# Patient Record
Sex: Female | Born: 1960
Health system: Southern US, Community
[De-identification: ages and names within clinical notes are randomized; demographics above are authoritative.]

---

## 2001-03-20 ENCOUNTER — Emergency Department (HOSPITAL_COMMUNITY): Admission: EM | Admit: 2001-03-20 | Discharge: 2001-03-20 | Payer: Self-pay | Admitting: Emergency Medicine

## 2001-03-20 ENCOUNTER — Encounter: Payer: Self-pay | Admitting: Emergency Medicine

## 2001-05-14 ENCOUNTER — Other Ambulatory Visit: Admission: RE | Admit: 2001-05-14 | Discharge: 2001-05-14 | Payer: Self-pay | Admitting: Obstetrics and Gynecology

## 2001-07-06 ENCOUNTER — Ambulatory Visit (HOSPITAL_COMMUNITY): Admission: RE | Admit: 2001-07-06 | Discharge: 2001-07-06 | Payer: Self-pay | Admitting: Orthopedic Surgery

## 2001-07-06 ENCOUNTER — Encounter: Payer: Self-pay | Admitting: Orthopedic Surgery

## 2001-08-15 ENCOUNTER — Encounter: Admission: RE | Admit: 2001-08-15 | Discharge: 2001-08-15 | Payer: Self-pay | Admitting: Family Medicine

## 2001-08-15 ENCOUNTER — Encounter: Payer: Self-pay | Admitting: Family Medicine

## 2001-08-28 ENCOUNTER — Encounter: Payer: Self-pay | Admitting: Neurosurgery

## 2001-08-31 ENCOUNTER — Encounter: Payer: Self-pay | Admitting: Neurosurgery

## 2001-08-31 ENCOUNTER — Ambulatory Visit (HOSPITAL_COMMUNITY): Admission: RE | Admit: 2001-08-31 | Discharge: 2001-09-01 | Payer: Self-pay | Admitting: Neurosurgery

## 2001-10-01 ENCOUNTER — Ambulatory Visit (HOSPITAL_COMMUNITY): Admission: RE | Admit: 2001-10-01 | Discharge: 2001-10-01 | Payer: Self-pay | Admitting: Neurosurgery

## 2001-10-01 ENCOUNTER — Encounter: Payer: Self-pay | Admitting: Neurosurgery

## 2001-10-25 ENCOUNTER — Encounter: Admission: RE | Admit: 2001-10-25 | Discharge: 2001-10-25 | Payer: Self-pay | Admitting: Neurosurgery

## 2001-10-25 ENCOUNTER — Encounter: Payer: Self-pay | Admitting: Neurosurgery

## 2002-03-05 ENCOUNTER — Encounter: Admission: RE | Admit: 2002-03-05 | Discharge: 2002-03-05 | Payer: Self-pay | Admitting: Neurosurgery

## 2002-03-05 ENCOUNTER — Encounter: Payer: Self-pay | Admitting: Neurosurgery

## 2002-10-04 ENCOUNTER — Other Ambulatory Visit: Admission: RE | Admit: 2002-10-04 | Discharge: 2002-10-04 | Payer: Self-pay | Admitting: Obstetrics & Gynecology

## 2003-05-02 ENCOUNTER — Ambulatory Visit (HOSPITAL_COMMUNITY): Admission: RE | Admit: 2003-05-02 | Discharge: 2003-05-02 | Payer: Self-pay | Admitting: Family Medicine

## 2003-05-02 ENCOUNTER — Encounter: Payer: Self-pay | Admitting: Family Medicine

## 2004-10-11 ENCOUNTER — Ambulatory Visit (HOSPITAL_COMMUNITY): Admission: RE | Admit: 2004-10-11 | Discharge: 2004-10-11 | Payer: Self-pay | Admitting: Family Medicine

## 2005-02-21 ENCOUNTER — Other Ambulatory Visit: Admission: RE | Admit: 2005-02-21 | Discharge: 2005-02-21 | Payer: Self-pay | Admitting: Family Medicine

## 2005-07-05 ENCOUNTER — Other Ambulatory Visit: Admission: RE | Admit: 2005-07-05 | Discharge: 2005-07-05 | Payer: Self-pay | Admitting: Family Medicine

## 2005-11-09 ENCOUNTER — Ambulatory Visit (HOSPITAL_COMMUNITY): Admission: RE | Admit: 2005-11-09 | Discharge: 2005-11-09 | Payer: Self-pay | Admitting: Family Medicine

## 2006-02-01 ENCOUNTER — Other Ambulatory Visit: Admission: RE | Admit: 2006-02-01 | Discharge: 2006-02-01 | Payer: Self-pay | Admitting: Family Medicine

## 2006-06-07 ENCOUNTER — Other Ambulatory Visit: Admission: RE | Admit: 2006-06-07 | Discharge: 2006-06-07 | Payer: Self-pay | Admitting: Family Medicine

## 2007-01-15 ENCOUNTER — Other Ambulatory Visit: Admission: RE | Admit: 2007-01-15 | Discharge: 2007-01-15 | Payer: Self-pay | Admitting: Family Medicine

## 2007-03-05 ENCOUNTER — Encounter: Admission: RE | Admit: 2007-03-05 | Discharge: 2007-03-05 | Payer: Self-pay | Admitting: Family Medicine

## 2007-11-13 ENCOUNTER — Ambulatory Visit (HOSPITAL_COMMUNITY): Admission: RE | Admit: 2007-11-13 | Discharge: 2007-11-13 | Payer: Self-pay | Admitting: Family Medicine

## 2008-03-26 ENCOUNTER — Other Ambulatory Visit: Admission: RE | Admit: 2008-03-26 | Discharge: 2008-03-26 | Payer: Self-pay | Admitting: Family Medicine

## 2008-10-15 ENCOUNTER — Ambulatory Visit (HOSPITAL_COMMUNITY): Admission: RE | Admit: 2008-10-15 | Discharge: 2008-10-15 | Payer: Self-pay | Admitting: Family Medicine

## 2009-04-28 ENCOUNTER — Other Ambulatory Visit: Admission: RE | Admit: 2009-04-28 | Discharge: 2009-04-28 | Payer: Self-pay | Admitting: Family Medicine

## 2009-08-26 ENCOUNTER — Ambulatory Visit (HOSPITAL_COMMUNITY): Admission: RE | Admit: 2009-08-26 | Discharge: 2009-08-26 | Payer: Self-pay | Admitting: Urology

## 2009-11-23 ENCOUNTER — Ambulatory Visit (HOSPITAL_COMMUNITY): Admission: RE | Admit: 2009-11-23 | Discharge: 2009-11-23 | Payer: Self-pay | Admitting: Family Medicine

## 2010-11-09 ENCOUNTER — Other Ambulatory Visit (HOSPITAL_COMMUNITY): Payer: Self-pay | Admitting: Family Medicine

## 2010-11-09 DIAGNOSIS — Z1231 Encounter for screening mammogram for malignant neoplasm of breast: Secondary | ICD-10-CM

## 2010-11-25 ENCOUNTER — Ambulatory Visit (HOSPITAL_COMMUNITY)
Admission: RE | Admit: 2010-11-25 | Discharge: 2010-11-25 | Disposition: A | Discharge status: Home / Self Care | Payer: BLUE CROSS/BLUE SHIELD | Source: Ambulatory Visit | Attending: Family Medicine | Admitting: Family Medicine

## 2010-11-25 DIAGNOSIS — Z1231 Encounter for screening mammogram for malignant neoplasm of breast: Secondary | ICD-10-CM | POA: Insufficient documentation

## 2010-12-10 NOTE — Op Note (Signed)
[place]. [facility]  Patient:    Sandra Hale, Sandra Hale Visit Number: [id number] MRN: [id number]          Service Type: DSU Location: 3000 [id number] [id number] Attending Physician:  Mariam Dollar Dictated by:   Garlon Hatchet., M.D. Proc. Date: 08/31/01 Admit Date:  08/31/2001 Discharge Date: 09/01/2001                             Operative Report  PREOPERATIVE DIAGNOSIS:  C7 radiculopathy, right, from cervical spondylosis and ruptured disk at C6-7.  PROCEDURE:  Anterior cervical diskectomy and fusion at C6-7 with two ______ patellar wedge 6 mm, and 22 mm ______ plate, and four 13 mm variable angled screws.  SURGEON:  Garlon Hatchet., M.D.  ASSISTANT:  Reinaldo Meeker, M.D.  ANESTHESIA:  General endotracheal.  INDICATIONS FOR PROCEDURE:  The patient is a very pleasant 50 year old female who has long-standing neck and right arm pain radiating down to her first two fingers.  She also had weakness of her triceps at about 4/5.  Imaging showed severe spinal cord compression of the right C6-7 nerve root from spondylitic disease, and a ruptured disk at C6-7.  The patient was extensively counseled and recommended anterior cervical diskectomy and fusion. She decided to proceed forward.  DESCRIPTION OF PROCEDURE:  The patient was brought to the operating room. Induced under general anesthesia.  The patient was placed supine with neck in slight extension, 5 pounds of Halter traction.  The right side of her neck was prepped and draped in usual sterile fashion.  With x-ray to localize needle of the C6-7 disk space, a curvilinear incision was made just off the ______ the sternocleidomastoid.  The superior part of the platysma was dissected out, and divided longitudinally.  As it was being divided, a small superficial vein was opened up.  This was coagulated and divided, and then the remainder of the ______ was divided.  The ______, sternocleidomastoid, ______ was  ______ prevertebral fascia.  The fascia was dissected with Kitners.  Longus coli was then reflected laterally.  Intraoperative x-ray confirmed localization of the C6-7 disk space.  A self-retaining retractor was placed.  Annulotomies with 11 blade scalpel, pituitary rongeurs were used to remove the ______ and annulus.  High speed drill was used to drill out the remainder of the disk space down the posterior longitudinal ligament.  Posterior osteophyte then using 2 and 3 mm Kerrison punch, the remainder of the posterior longitudinal ligament was removed in a piecemeal fashion.  Then the operating microscope was draped, brought into the field.  Under excellent illumination, the thecal sac was visualized and decompressed radically out both C7 neural foramen.  There was a moderate amount of thecal sac compression coming from disk material.  The ______, as well as extensive hypertrophy compressing the right C7 nerve root. This was all removed in a piecemeal fashion.  Both C7 neural foramina were widely opened.  Next, a angled nerve hook noted no further compression.  The end plates were then prepared to receive the bone graft.  Interbody surface was placed, a 6 mm patellar wedge was cut 2 mm off the depth and inserted under compression, approximately 1 mm deep to the anterior vertebral body line, then the weight was removed, and the anterior vertebral bodies were prepared to receive the plate.  A 23 mm ______ plate was sized, selected, and four 13 mm variable angled screws were drilled,  tapped, and then placed. Postoperative x-ray confirmed good position of plates, screws, and bone graft. The wound was copiously irrigated.  Meticulous hemostasis was maintained.  The platysma was reapproximated with 3-0 interrupted Vicryl.  The skin was closed with running 4-0 subcuticular.  Benzoin and Steri-Strips were applied.  The patient went to the recovery room in stable condition.  At the end of the case,  all sponge, needle, and instrument counts were correct. Dictated by:   Garlon Hatchet., M.D. Attending Physician:  Mariam Dollar DD:  08/31/01 TD:  09/02/01 Job: [id number]

## 2011-02-15 IMAGING — RF DG VCUG
11 series · 20 of 24 positions shown · non-contrast
Comparison: None.

CLINICAL DATA: Evaluate for urethral diverticulum

VOIDING CYSTOURETHROGRAM
TECHNIQUE: After catheterization of the urinary bladder following
sterile technique the bladder was filled with 300 ml Cysto-hypaque
30% by drip infusion.  Serial spot images were obtained during
bladder filling and voiding.
Fluoroscopy Time: 3.1 minutes

[Series 1: run · 2 of 3 slices shown (1 of 11)]
[im 1/3]
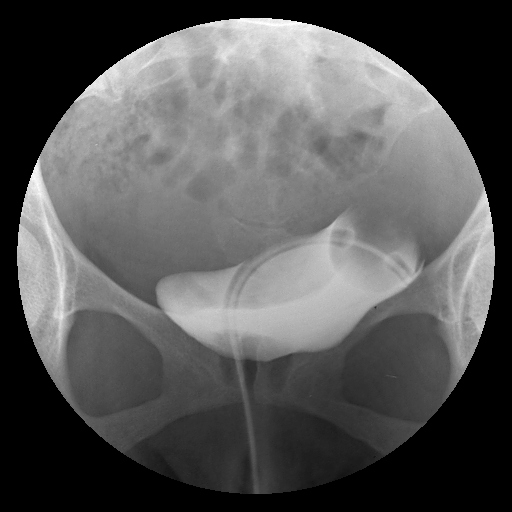
[im 2/3]
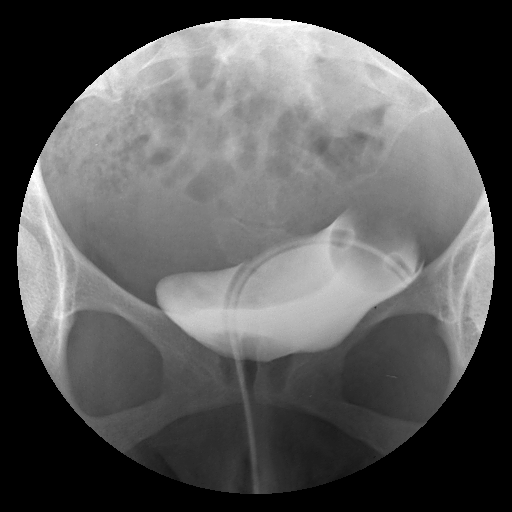

[Series 2: run · 1 of 1 slices shown (2 of 11)]
[im 1/1]
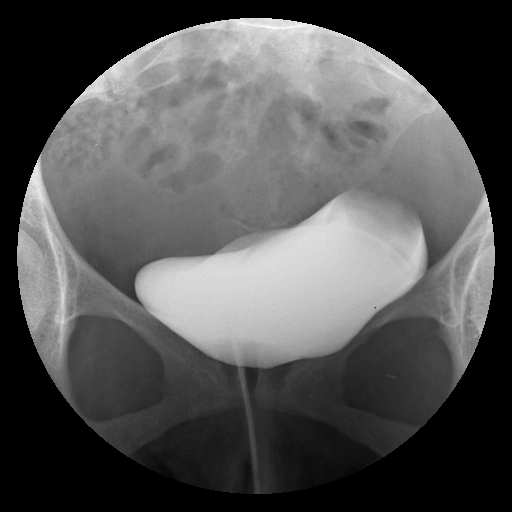

[Series 3: run · 1 of 1 slices shown (3 of 11)]
[im 1/1]
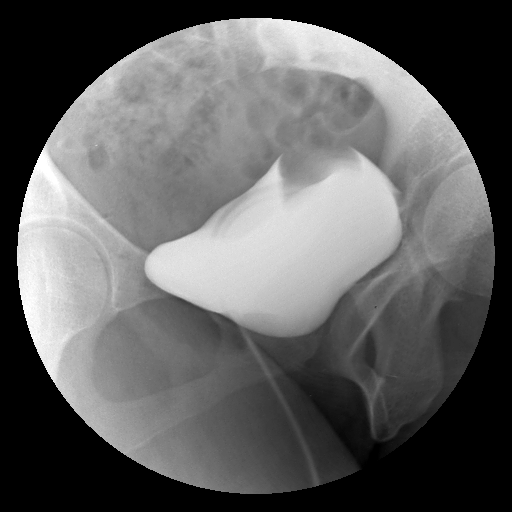

[Series 4: run · 1 of 1 slices shown (4 of 11)]
[im 1/1]
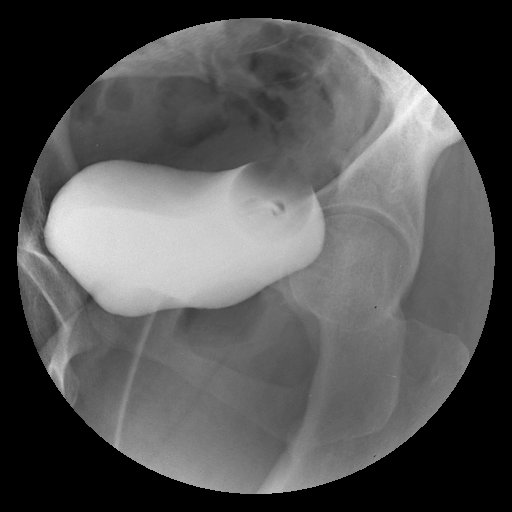

[Series 5: run · 1 of 1 slices shown (5 of 11)]
[im 1/1]
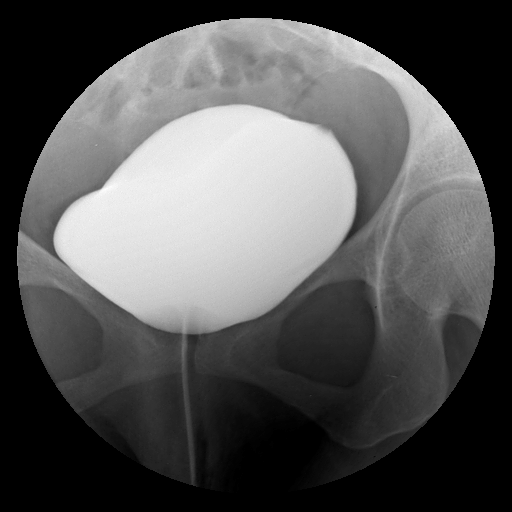

[Series 6: run · 3 of 4 slices shown (6 of 11)]
[im 1/4]
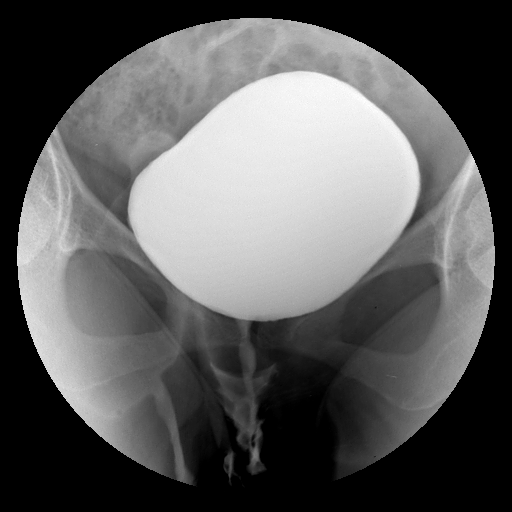
[im 3/4]
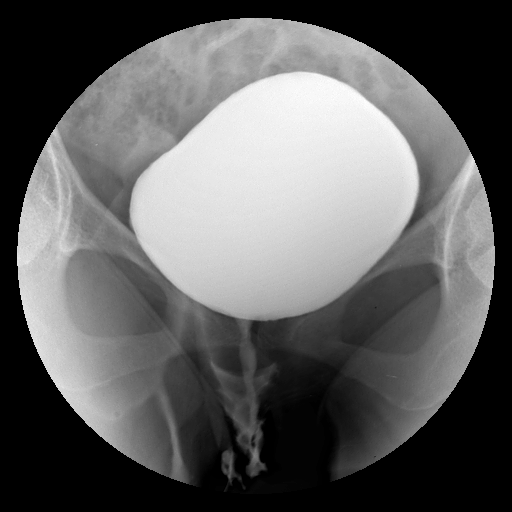
[im 4/4]
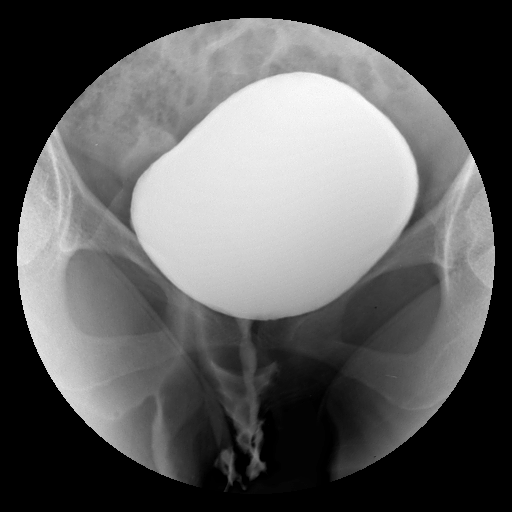

[Series 7: run · 3 of 3 slices shown (7 of 11)]
[im 1/3]
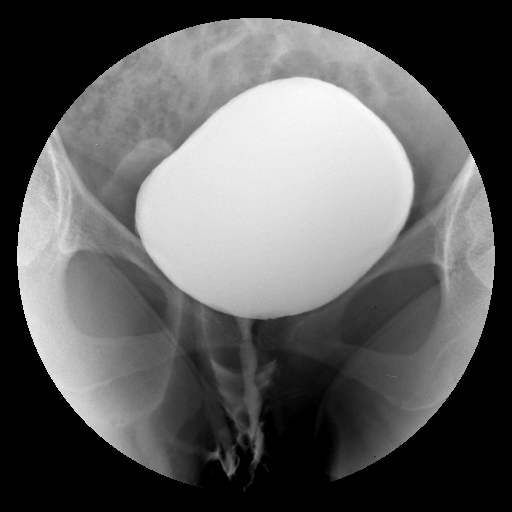
[im 2/3]
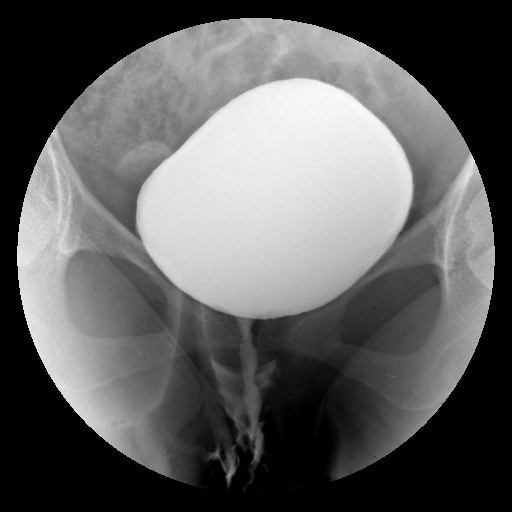
[im 3/3]
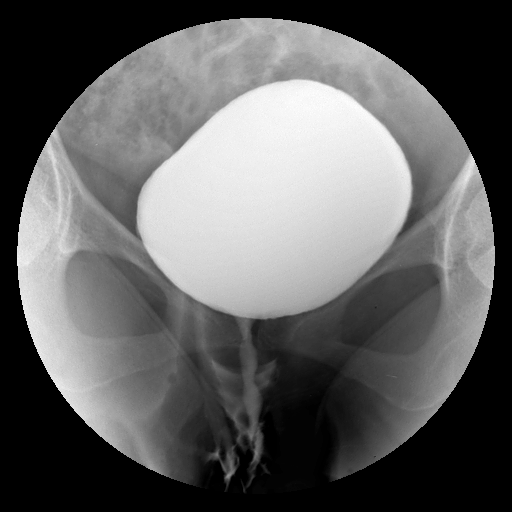

[Series 8: run · 2 of 3 slices shown (8 of 11)]
[im 2/3]
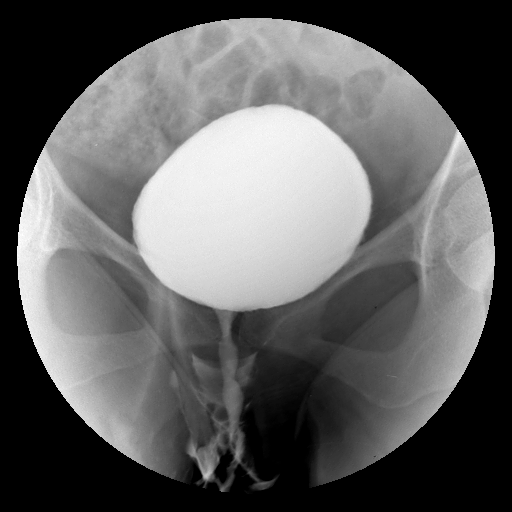
[im 3/3]
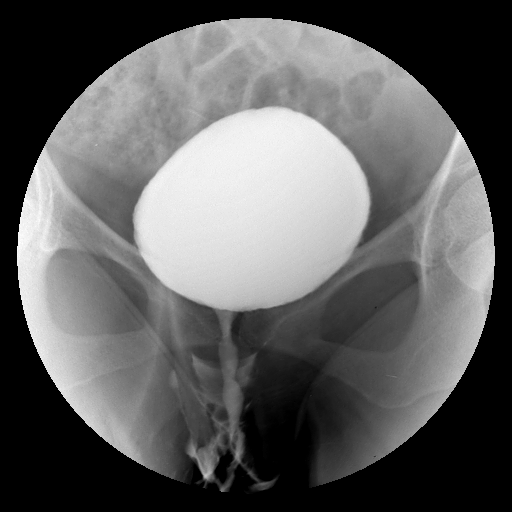

[Series 9: run · 3 of 3 slices shown (9 of 11)]
[im 1/3]
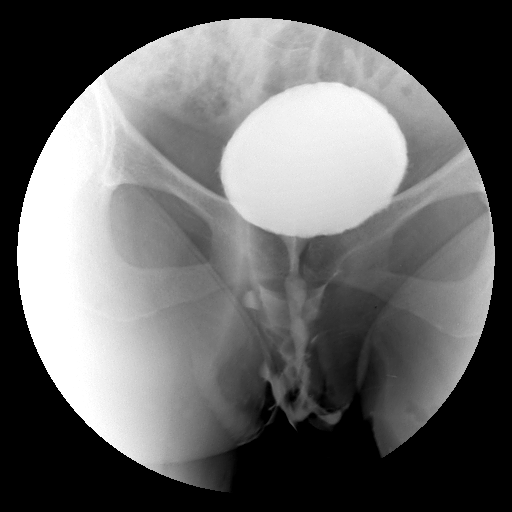
[im 2/3]
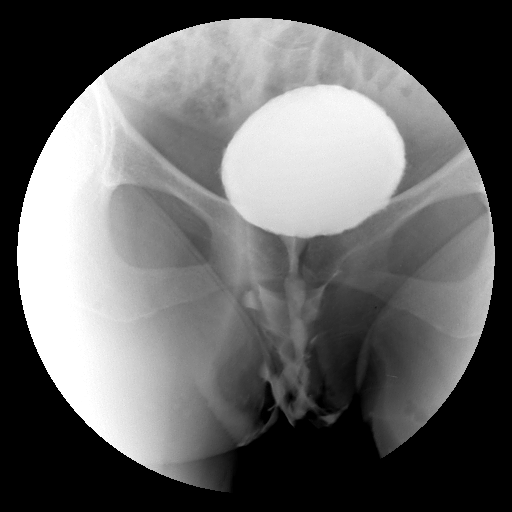
[im 3/3]
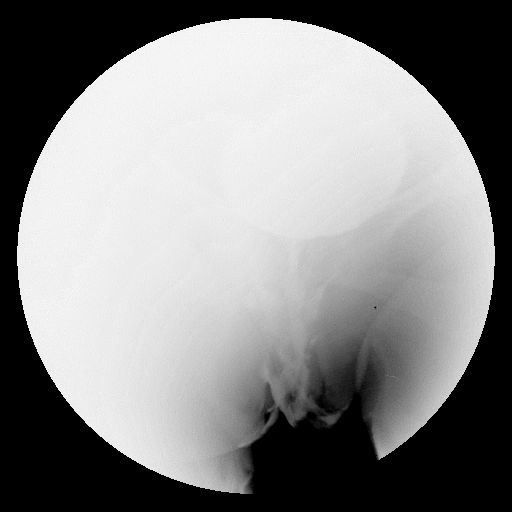

[Series 10: run · 1 of 2 slices shown (10 of 11)]
[im 2/2]
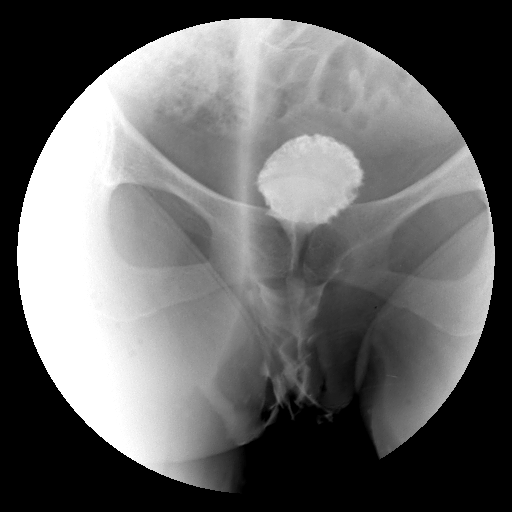

[Series 11: run · 2 of 2 slices shown (11 of 11)]
[im 1/2]
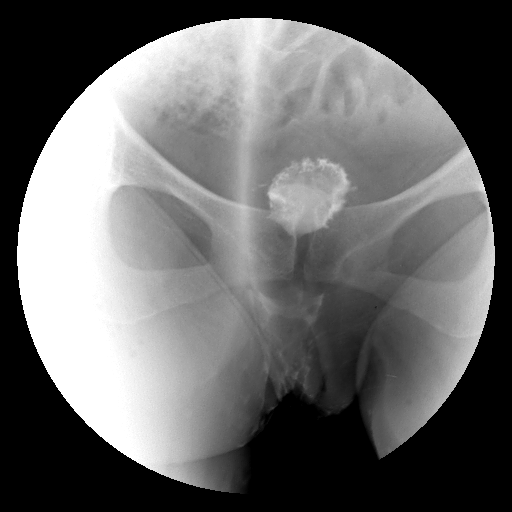
[im 2/2]
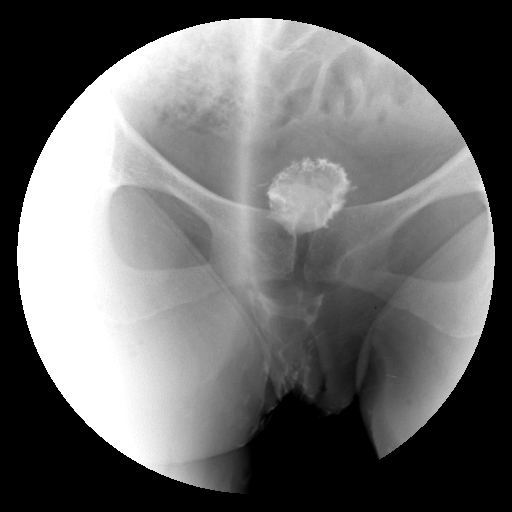

[20 of 24 positions shown; findings below may reference images not displayed]

FINDINGS: Bladder appears slightly low lying raises a question of
mild cystocele.  Bladder contours normal.  No vesicoureteral
reflux.  The urethra is normal.  No evidence of a urethral
diverticulum.
IMPRESSION: Negative VCUG.

## 2011-06-03 ENCOUNTER — Other Ambulatory Visit (HOSPITAL_COMMUNITY)
Admission: RE | Admit: 2011-06-03 | Discharge: 2011-06-03 | Disposition: A | Discharge status: Home / Self Care | Payer: BC Managed Care – PPO | Source: Ambulatory Visit | Attending: Family Medicine | Admitting: Family Medicine

## 2011-06-03 DIAGNOSIS — Z0000 Encounter for general adult medical examination without abnormal findings: Secondary | ICD-10-CM | POA: Insufficient documentation

## 2011-12-14 ENCOUNTER — Other Ambulatory Visit (HOSPITAL_COMMUNITY): Payer: Self-pay | Admitting: Family Medicine

## 2011-12-14 DIAGNOSIS — Z1231 Encounter for screening mammogram for malignant neoplasm of breast: Secondary | ICD-10-CM

## 2012-01-06 ENCOUNTER — Ambulatory Visit (HOSPITAL_COMMUNITY): Payer: BC Managed Care – PPO

## 2012-01-06 ENCOUNTER — Ambulatory Visit (HOSPITAL_COMMUNITY)
Admission: RE | Admit: 2012-01-06 | Discharge: 2012-01-06 | Disposition: A | Discharge status: Home / Self Care | Payer: BC Managed Care – PPO | Source: Ambulatory Visit | Attending: Family Medicine | Admitting: Family Medicine

## 2012-01-06 DIAGNOSIS — Z1231 Encounter for screening mammogram for malignant neoplasm of breast: Secondary | ICD-10-CM

## 2012-01-11 ENCOUNTER — Other Ambulatory Visit: Payer: Self-pay | Admitting: Family Medicine

## 2012-01-11 DIAGNOSIS — R928 Other abnormal and inconclusive findings on diagnostic imaging of breast: Secondary | ICD-10-CM

## 2012-01-19 ENCOUNTER — Ambulatory Visit
Admission: RE | Admit: 2012-01-19 | Discharge: 2012-01-19 | Disposition: A | Discharge status: Home / Self Care | Payer: BC Managed Care – PPO | Source: Ambulatory Visit | Attending: Family Medicine | Admitting: Family Medicine

## 2012-01-19 DIAGNOSIS — R928 Other abnormal and inconclusive findings on diagnostic imaging of breast: Secondary | ICD-10-CM

## 2012-11-12 ENCOUNTER — Ambulatory Visit: Payer: BC Managed Care – PPO | Attending: Podiatry | Admitting: Rehabilitation

## 2012-11-12 DIAGNOSIS — M25579 Pain in unspecified ankle and joints of unspecified foot: Secondary | ICD-10-CM | POA: Insufficient documentation

## 2012-11-12 DIAGNOSIS — IMO0001 Reserved for inherently not codable concepts without codable children: Secondary | ICD-10-CM | POA: Insufficient documentation

## 2012-11-12 DIAGNOSIS — M25673 Stiffness of unspecified ankle, not elsewhere classified: Secondary | ICD-10-CM | POA: Insufficient documentation

## 2012-11-12 DIAGNOSIS — M25676 Stiffness of unspecified foot, not elsewhere classified: Secondary | ICD-10-CM | POA: Insufficient documentation

## 2012-11-16 ENCOUNTER — Ambulatory Visit: Payer: BC Managed Care – PPO | Admitting: Physical Therapy

## 2012-11-20 ENCOUNTER — Ambulatory Visit: Payer: BC Managed Care – PPO | Admitting: Physical Therapy

## 2012-11-22 ENCOUNTER — Ambulatory Visit: Payer: BC Managed Care – PPO | Attending: Podiatry | Admitting: Physical Therapy

## 2012-11-22 DIAGNOSIS — M25676 Stiffness of unspecified foot, not elsewhere classified: Secondary | ICD-10-CM | POA: Insufficient documentation

## 2012-11-22 DIAGNOSIS — IMO0001 Reserved for inherently not codable concepts without codable children: Secondary | ICD-10-CM | POA: Insufficient documentation

## 2012-11-22 DIAGNOSIS — M25673 Stiffness of unspecified ankle, not elsewhere classified: Secondary | ICD-10-CM | POA: Insufficient documentation

## 2012-11-22 DIAGNOSIS — M25579 Pain in unspecified ankle and joints of unspecified foot: Secondary | ICD-10-CM | POA: Insufficient documentation

## 2012-11-27 ENCOUNTER — Ambulatory Visit: Payer: BC Managed Care – PPO | Admitting: Physical Therapy

## 2012-11-29 ENCOUNTER — Ambulatory Visit: Payer: BC Managed Care – PPO | Admitting: Physical Therapy

## 2012-12-04 ENCOUNTER — Ambulatory Visit: Payer: BC Managed Care – PPO | Admitting: Physical Therapy

## 2012-12-06 ENCOUNTER — Ambulatory Visit: Payer: BC Managed Care – PPO | Admitting: Physical Therapy

## 2012-12-10 ENCOUNTER — Ambulatory Visit: Payer: BC Managed Care – PPO | Admitting: Physical Therapy

## 2012-12-11 ENCOUNTER — Ambulatory Visit: Payer: BC Managed Care – PPO | Admitting: Physical Therapy

## 2012-12-13 ENCOUNTER — Ambulatory Visit: Payer: BC Managed Care – PPO | Admitting: Physical Therapy

## 2012-12-18 ENCOUNTER — Ambulatory Visit: Payer: BC Managed Care – PPO | Admitting: Physical Therapy

## 2012-12-20 ENCOUNTER — Ambulatory Visit: Payer: BC Managed Care – PPO | Admitting: Physical Therapy

## 2012-12-31 ENCOUNTER — Other Ambulatory Visit (HOSPITAL_COMMUNITY): Payer: Self-pay | Admitting: Family Medicine

## 2012-12-31 DIAGNOSIS — Z1231 Encounter for screening mammogram for malignant neoplasm of breast: Secondary | ICD-10-CM

## 2013-01-21 ENCOUNTER — Ambulatory Visit (HOSPITAL_COMMUNITY)
Admission: RE | Admit: 2013-01-21 | Discharge: 2013-01-21 | Disposition: A | Discharge status: Home / Self Care | Payer: BC Managed Care – PPO | Source: Ambulatory Visit | Attending: Family Medicine | Admitting: Family Medicine

## 2013-01-21 DIAGNOSIS — Z1231 Encounter for screening mammogram for malignant neoplasm of breast: Secondary | ICD-10-CM | POA: Insufficient documentation

## 2013-06-26 ENCOUNTER — Other Ambulatory Visit (HOSPITAL_COMMUNITY)
Admission: RE | Admit: 2013-06-26 | Discharge: 2013-06-26 | Disposition: A | Discharge status: Home / Self Care | Payer: BC Managed Care – PPO | Source: Ambulatory Visit | Attending: Family Medicine | Admitting: Family Medicine

## 2013-06-26 ENCOUNTER — Other Ambulatory Visit: Payer: Self-pay | Admitting: Physician Assistant

## 2013-06-26 DIAGNOSIS — Z0000 Encounter for general adult medical examination without abnormal findings: Secondary | ICD-10-CM | POA: Insufficient documentation

## 2014-01-07 ENCOUNTER — Other Ambulatory Visit (HOSPITAL_COMMUNITY): Payer: Self-pay | Admitting: Family Medicine

## 2014-01-07 DIAGNOSIS — Z1231 Encounter for screening mammogram for malignant neoplasm of breast: Secondary | ICD-10-CM

## 2014-02-03 ENCOUNTER — Ambulatory Visit (HOSPITAL_COMMUNITY)
Admission: RE | Admit: 2014-02-03 | Discharge: 2014-02-03 | Disposition: A | Discharge status: Home / Self Care | Payer: BC Managed Care – PPO | Source: Ambulatory Visit | Attending: Family Medicine | Admitting: Family Medicine

## 2014-02-03 DIAGNOSIS — Z1231 Encounter for screening mammogram for malignant neoplasm of breast: Secondary | ICD-10-CM

## 2014-02-04 ENCOUNTER — Other Ambulatory Visit: Payer: Self-pay | Admitting: Family Medicine

## 2014-02-04 DIAGNOSIS — R928 Other abnormal and inconclusive findings on diagnostic imaging of breast: Secondary | ICD-10-CM

## 2014-02-10 ENCOUNTER — Ambulatory Visit
Admission: RE | Admit: 2014-02-10 | Discharge: 2014-02-10 | Disposition: A | Discharge status: Home / Self Care | Payer: BC Managed Care – PPO | Source: Ambulatory Visit | Attending: Family Medicine | Admitting: Family Medicine

## 2014-02-10 ENCOUNTER — Other Ambulatory Visit: Payer: Self-pay | Admitting: Family Medicine

## 2014-02-10 DIAGNOSIS — R928 Other abnormal and inconclusive findings on diagnostic imaging of breast: Secondary | ICD-10-CM

## 2014-08-26 ENCOUNTER — Other Ambulatory Visit (INDEPENDENT_AMBULATORY_CARE_PROVIDER_SITE_OTHER): Payer: Self-pay | Admitting: Surgery

## 2014-08-26 NOTE — H&P (Signed)
Sandra Hale 08/26/2014 10:22 AM Location: [facility] Patient #: [id number] DOB: [birth year] Divorced / Language: [place] / Race: Black or African American Female History of Present Illness Sandra Sportsman MD; 08/26/2014 7:07 PM) Patient words: eval hems.  The patient is a 54 year old female who presents with hemorrhoids. Pleasant woman sent by Dr. Romeo Apple for concerns of symptomatic hemorrhoids. Patient's noticed external hemorrhoids for many months. Often struggles with constipation. Started on MiraLAX. Intermittently compliant. Still gets some flares. Get some annoying bleeding and irritation with it. Had a colonoscopy 2 years ago that was normal. Has a bowel movement about twice a week. Can walk a half hour without difficulty. No history of Crohn's or ulcerative colitis. No heartburn or reflux. No inflammatory bowel disease. No history of fissure or fistula. No history of pilonidal disease. Only surgery was tubal ligation. Other Problems Gilmer Mor, CMA; 08/26/2014 10:22 AM) Hemorrhoids  Past Surgical History Gilmer Mor, CMA; 08/26/2014 10:22 AM) Colon Polyp Removal - Colonoscopy Foot Surgery Left.  Diagnostic Studies History Gilmer Mor, CMA; 08/26/2014 10:22 AM) Colonoscopy 1-5 years ago Mammogram within last year Pap Smear 1-5 years ago  Allergies Gilmer Mor, CMA; 08/26/2014 10:26 AM) No Known Drug Allergies 08/26/2014  Medication History (Sonya Bynum, CMA; 08/26/2014 10:26 AM) No Current Medications  Social History Gilmer Mor, CMA; 08/26/2014 10:22 AM) Alcohol use Occasional alcohol use. Caffeine use Carbonated beverages, Tea. No drug use Tobacco use Never smoker.  Family History Gilmer Mor, CMA; 08/26/2014 10:22 AM) Breast Cancer Mother. Colon Polyps Brother. Diabetes Mellitus Mother. Prostate Cancer Father.  Pregnancy / Birth History Gilmer Mor, CMA; 08/26/2014 10:22 AM) Age at menarche 12 years. Contraceptive History Oral  contraceptives. Gravida 2 Irregular periods Maternal age 46-30 Para 2     Review of Systems Lamar Laundry Bynum CMA; 08/26/2014 10:22 AM) General Not Present- Appetite Loss, Chills, Fatigue, Fever, Night Sweats, Weight Gain and Weight Loss. Skin Present- Dryness. Not Present- Change in Wart/Mole, Hives, Jaundice, New Lesions, Non-Healing Wounds, Rash and Ulcer. HEENT Present- Wears glasses/contact lenses. Not Present- Earache, Hearing Loss, Hoarseness, Nose Bleed, Oral Ulcers, Ringing in the Ears, Seasonal Allergies, Sinus Pain, Sore Throat, Visual Disturbances and Yellow Eyes. Respiratory Not Present- Bloody sputum, Chronic Cough, Difficulty Breathing, Snoring and Wheezing. Breast Not Present- Breast Mass, Breast Pain, Nipple Discharge and Skin Changes. Cardiovascular Not Present- Chest Pain, Difficulty Breathing Lying Down, Leg Cramps, Palpitations, Rapid Heart Rate, Shortness of Breath and Swelling of Extremities. Gastrointestinal Present- Constipation and Hemorrhoids. Not Present- Abdominal Pain, Bloating, Bloody Stool, Change in Bowel Habits, Chronic diarrhea, Difficulty Swallowing, Excessive gas, Gets full quickly at meals, Indigestion, Nausea, Rectal Pain and Vomiting. Female Genitourinary Not Present- Frequency, Nocturia, Painful Urination, Pelvic Pain and Urgency. Musculoskeletal Not Present- Back Pain, Joint Pain, Joint Stiffness, Muscle Pain, Muscle Weakness and Swelling of Extremities. Neurological Not Present- Decreased Memory, Fainting, Headaches, Numbness, Seizures, Tingling, Tremor, Trouble walking and Weakness. Psychiatric Not Present- Anxiety, Bipolar, Change in Sleep Pattern, Depression, Fearful and Frequent crying. Endocrine Not Present- Cold Intolerance, Excessive Hunger, Hair Changes, Heat Intolerance, Hot flashes and New Diabetes. Hematology Not Present- Easy Bruising, Excessive bleeding, Gland problems, HIV and Persistent Infections.  Vitals (Sonya Bynum CMA; 08/26/2014 10:26  AM) 08/26/2014 10:25 AM Weight: 167 lb Height: 62in Body Surface Area: 1.82 m Body Mass Index: 30.54 kg/m Temp.: 97.84F(Temporal)  Pulse: 72 (Regular)  BP: 130/74 (Sitting, Left Arm, Standard)     Physical Exam Sandra Sportsman MD; 08/26/2014 10:56 AM)  General Mental Status-Alert. General Appearance-Not  in acute distress, Not Sickly. Orientation-Oriented X3. Hydration-Well hydrated. Voice-Normal.  Integumentary Global Assessment Upon inspection and palpation of skin surfaces of the - Axillae: non-tender, no inflammation or ulceration, no drainage. and Distribution of scalp and body hair is normal. General Characteristics Temperature - normal warmth is noted.  Head and Neck Head-normocephalic, atraumatic with no lesions or palpable masses. Face Global Assessment - atraumatic, no absence of expression. Neck Global Assessment - no abnormal movements, no bruit auscultated on the right, no bruit auscultated on the left, no decreased range of motion, non-tender. Trachea-midline. Thyroid Gland Characteristics - non-tender.  Eye Eyeball - Left-Extraocular movements intact, No Nystagmus. Eyeball - Right-Extraocular movements intact, No Nystagmus. Cornea - Left-No Hazy. Cornea - Right-No Hazy. Sclera/Conjunctiva - Left-No scleral icterus, No Discharge. Sclera/Conjunctiva - Right-No scleral icterus, No Discharge. Pupil - Left-Direct reaction to light normal. Pupil - Right-Direct reaction to light normal.  ENMT Ears Pinna - Left - no drainage observed, no generalized tenderness observed. Right - no drainage observed, no generalized tenderness observed. Nose and Sinuses External Inspection of the Nose - no destructive lesion observed. Inspection of the nares - Left - quiet respiration. Right - quiet respiration. Mouth and Throat Lips - Upper Lip - no fissures observed, no pallor noted. Lower Lip - no fissures observed, no pallor noted.  Nasopharynx - no discharge present. Oral Cavity/Oropharynx - Tongue - no dryness observed. Oral Mucosa - no cyanosis observed. Hypopharynx - no evidence of airway distress observed.  Chest and Lung Exam Inspection Movements - Normal and Symmetrical. Accessory muscles - No use of accessory muscles in breathing. Palpation Palpation of the chest reveals - Non-tender. Auscultation Breath sounds - Normal and Clear.  Cardiovascular Auscultation Rhythm - Regular. Murmurs & Other Heart Sounds - Auscultation of the heart reveals - No Murmurs and No Systolic Clicks.  Abdomen Inspection Inspection of the abdomen reveals - No Visible peristalsis and No Abnormal pulsations. Umbilicus - No Bleeding, No Urine drainage. Palpation/Percussion Palpation and Percussion of the abdomen reveal - Soft, Non Tender, No Rebound tenderness, No Rigidity (guarding) and No Cutaneous hyperesthesia.  Female Genitourinary Sexual Maturity Tanner 5 - Adult hair pattern. Note: No vaginal bleeding. Mild physiologic discharge   Rectal Note: Obvious right anterior greater than right posterior external hemorrhoids. The right anterior consistent with internal chronically prolapsed hemorrhoid. No fissure or fistula. Normal sphincter tone. Tolerated anoscopy and digital exam.  Anoscopy notes irritated left lateral internal and right posterior piles. No proctitis.   Peripheral Vascular Upper Extremity Inspection - Left - No Cyanotic nailbeds, Not Ischemic. Right - No Cyanotic nailbeds, Not Ischemic.  Neurologic Neurologic evaluation reveals -normal attention span and ability to concentrate, able to name objects and repeat phrases. Appropriate fund of knowledge , normal sensation and normal coordination. Mental Status Affect - not angry, not paranoid. Cranial Nerves-Normal Bilaterally. Gait-Normal.  Neuropsychiatric Mental status exam performed with findings of-able to articulate well with normal  speech/language, rate, volume and coherence, thought content normal with ability to perform basic computations and apply abstract reasoning and no evidence of hallucinations, delusions, obsessions or homicidal/suicidal ideation.  Musculoskeletal Global Assessment Spine, Ribs and Pelvis - no instability, subluxation or laxity. Right Upper Extremity - no instability, subluxation or laxity.  Lymphatic Head & Neck  General Head & Neck Lymphatics: Bilateral - Description - No Localized lymphadenopathy. Axillary  General Axillary Region: Bilateral - Description - No Localized lymphadenopathy. Femoral & Inguinal  Generalized Femoral & Inguinal Lymphatics: Left - Description - No Localized lymphadenopathy. Right -  Description - No Localized lymphadenopathy.   Results Sandra Hale(Loleta Frommelt C. Analie Katzman MD; 08/26/2014 7:07 PM) Procedures  Name Value Date Hemorrhoids Procedure Anal exam: External Hemorrhoid Skin tag Internal exam: Internal Hemorroids ( non-bleeding) Other: Obvious right anterior greater than right posterior external hemorrhoids. The right anterior consistent with internal chronically prolapsed hemorrhoid. No fissure or fistula. Normal sphincter tone. Tolerated anoscopy and digital exam. Anoscopy notes irritated left lateral internal and right posterior piles. No proctitis.  Performed: 08/26/2014 10:57 AM    Assessment & Plan Sandra Hale(Deadrian Toya C. Orion Vandervort MD; 08/26/2014 11:08 AM)  EXTERNAL HEMORRHOIDS WITH COMPLICATION (455.5  K64.8) Impression: Persistently symptomatic hemorrhoids. It is hard to keep clean with some irritation and discomfort. She wishes to be aggressive and have them repaired especially since her bowel on a better place and she is having less constipation.  Think is reasonable for him THD hemorrhoidal ligation on the internal piles and then external hemorrhoidectomy of the outside tissue. I did caution her she will require a while to recover from this and needs to be off  work at least a week or 2 given the sensitive location.  Current Plans Schedule for Surgery The anatomy & physiology of the anorectal region was discussed. The pathophysiology of hemorrhoids and differential diagnosis was discussed. Natural history risks without surgery was discussed. I stressed the importance of a bowel regimen to have daily soft bowel movements to minimize progression of disease. Interventions such as sclerotherapy & banding were discussed.  The patient's symptoms are not adequately controlled by medicines and other non-operative treatments. I feel the risks & problems of no surgery outweigh the operative risks; therefore, I recommended surgery to treat the hemorrhoids by ligation, pexy, and possible resection.  Risks such as bleeding, infection, urinary difficulties, need for further treatment, heart attack, death, and other risks were discussed. I noted a good likelihood this will help address the problem. Goals of post-operative recovery were discussed as well. Possibility that this will not correct all symptoms was explained. Post-operative pain, bleeding, constipation, and other problems after surgery were discussed. We will work to minimize complications. Educational handouts further explaining the pathology, treatment options, and bowel regimen were given as well. Questions were answered. The patient expresses understanding & wishes to proceed with surgery. Pt Education - [facility] Hemorrhoids (Milano Rosevear) Pt Education - [facility] Good Bowel Health (Michaelle Bottomley) Pt Education - [facility] Rectal Surgery HCI (Marshelle Bilger): discussed with patient and provided information. SIMPLE SECOND DEGREE HEMORRHOID (455.8  K64.1) Impression: Plan external hemorrhoidectomies with internal ligation.  Current Plans ANOSCOPY, DIAGNOSTIC ([id number])  Sandra SportsmanSteven C. Madisin Hasan, M.D., F.A.C.S. Gastrointestinal and Minimally Invasive Surgery [facility] [address] [place], [place] [id number] [phone]  [phone] Main / Paging  '

## 2015-11-05 DIAGNOSIS — Z0000 Encounter for general adult medical examination without abnormal findings: Secondary | ICD-10-CM | POA: Diagnosis not present

## 2015-11-09 DIAGNOSIS — H40023 Open angle with borderline findings, high risk, bilateral: Secondary | ICD-10-CM | POA: Diagnosis not present

## 2015-11-09 DIAGNOSIS — Z83511 Family history of glaucoma: Secondary | ICD-10-CM | POA: Diagnosis not present

## 2015-12-11 DIAGNOSIS — Z713 Dietary counseling and surveillance: Secondary | ICD-10-CM | POA: Diagnosis not present

## 2016-02-05 DIAGNOSIS — Z713 Dietary counseling and surveillance: Secondary | ICD-10-CM | POA: Diagnosis not present

## 2016-03-09 DIAGNOSIS — N900 Mild vulvar dysplasia: Secondary | ICD-10-CM | POA: Diagnosis not present

## 2016-03-09 DIAGNOSIS — Z01419 Encounter for gynecological examination (general) (routine) without abnormal findings: Secondary | ICD-10-CM | POA: Diagnosis not present

## 2016-03-09 DIAGNOSIS — Z1231 Encounter for screening mammogram for malignant neoplasm of breast: Secondary | ICD-10-CM | POA: Diagnosis not present

## 2016-03-09 DIAGNOSIS — Z1151 Encounter for screening for human papillomavirus (HPV): Secondary | ICD-10-CM | POA: Diagnosis not present

## 2016-03-09 DIAGNOSIS — Z6830 Body mass index (BMI) 30.0-30.9, adult: Secondary | ICD-10-CM | POA: Diagnosis not present

## 2016-04-08 DIAGNOSIS — Z713 Dietary counseling and surveillance: Secondary | ICD-10-CM | POA: Diagnosis not present

## 2016-05-07 DIAGNOSIS — R55 Syncope and collapse: Secondary | ICD-10-CM | POA: Diagnosis not present

## 2016-05-07 DIAGNOSIS — R42 Dizziness and giddiness: Secondary | ICD-10-CM | POA: Diagnosis not present

## 2016-05-07 DIAGNOSIS — M7582 Other shoulder lesions, left shoulder: Secondary | ICD-10-CM | POA: Diagnosis not present

## 2016-05-09 DIAGNOSIS — E559 Vitamin D deficiency, unspecified: Secondary | ICD-10-CM | POA: Diagnosis not present

## 2016-05-09 DIAGNOSIS — H8110 Benign paroxysmal vertigo, unspecified ear: Secondary | ICD-10-CM | POA: Diagnosis not present

## 2016-05-09 DIAGNOSIS — R42 Dizziness and giddiness: Secondary | ICD-10-CM | POA: Diagnosis not present

## 2016-05-30 DIAGNOSIS — H40023 Open angle with borderline findings, high risk, bilateral: Secondary | ICD-10-CM | POA: Diagnosis not present

## 2017-04-14 DIAGNOSIS — H40013 Open angle with borderline findings, low risk, bilateral: Secondary | ICD-10-CM | POA: Diagnosis not present

## 2017-04-19 DIAGNOSIS — N611 Abscess of the breast and nipple: Secondary | ICD-10-CM | POA: Diagnosis not present

## 2017-04-27 DIAGNOSIS — Z1231 Encounter for screening mammogram for malignant neoplasm of breast: Secondary | ICD-10-CM | POA: Diagnosis not present

## 2017-05-15 ENCOUNTER — Other Ambulatory Visit (HOSPITAL_COMMUNITY)
Admission: RE | Admit: 2017-05-15 | Discharge: 2017-05-15 | Disposition: A | Discharge status: Home / Self Care | Payer: BLUE CROSS/BLUE SHIELD | Source: Ambulatory Visit | Attending: Physician Assistant | Admitting: Physician Assistant

## 2017-05-15 ENCOUNTER — Other Ambulatory Visit: Payer: Self-pay | Admitting: Physician Assistant

## 2017-05-15 DIAGNOSIS — E559 Vitamin D deficiency, unspecified: Secondary | ICD-10-CM | POA: Diagnosis not present

## 2017-05-15 DIAGNOSIS — Z13220 Encounter for screening for lipoid disorders: Secondary | ICD-10-CM | POA: Diagnosis not present

## 2017-05-15 DIAGNOSIS — Z0000 Encounter for general adult medical examination without abnormal findings: Secondary | ICD-10-CM | POA: Insufficient documentation

## 2017-05-15 DIAGNOSIS — Z124 Encounter for screening for malignant neoplasm of cervix: Secondary | ICD-10-CM | POA: Diagnosis not present

## 2017-05-15 DIAGNOSIS — Z23 Encounter for immunization: Secondary | ICD-10-CM | POA: Diagnosis not present

## 2017-05-15 DIAGNOSIS — R7301 Impaired fasting glucose: Secondary | ICD-10-CM | POA: Diagnosis not present

## 2017-05-17 LAB — CYTOLOGY - PAP
ADEQUACY: ABSENT
DIAGNOSIS: NEGATIVE
HPV (WINDOPATH): DETECTED — AB

## 2017-06-07 DIAGNOSIS — E119 Type 2 diabetes mellitus without complications: Secondary | ICD-10-CM | POA: Diagnosis not present

## 2017-07-20 ENCOUNTER — Ambulatory Visit: Payer: BLUE CROSS/BLUE SHIELD

## 2017-07-27 ENCOUNTER — Ambulatory Visit: Payer: BLUE CROSS/BLUE SHIELD

## 2017-07-27 DIAGNOSIS — B078 Other viral warts: Secondary | ICD-10-CM | POA: Diagnosis not present

## 2017-07-27 DIAGNOSIS — L249 Irritant contact dermatitis, unspecified cause: Secondary | ICD-10-CM | POA: Diagnosis not present

## 2017-08-02 DIAGNOSIS — N6081 Other benign mammary dysplasias of right breast: Secondary | ICD-10-CM | POA: Diagnosis not present

## 2017-10-11 DIAGNOSIS — E119 Type 2 diabetes mellitus without complications: Secondary | ICD-10-CM | POA: Diagnosis not present

## 2018-04-23 DIAGNOSIS — H40023 Open angle with borderline findings, high risk, bilateral: Secondary | ICD-10-CM | POA: Diagnosis not present

## 2018-04-23 DIAGNOSIS — H40053 Ocular hypertension, bilateral: Secondary | ICD-10-CM | POA: Diagnosis not present

## 2018-05-10 DIAGNOSIS — H40023 Open angle with borderline findings, high risk, bilateral: Secondary | ICD-10-CM | POA: Diagnosis not present

## 2018-05-17 ENCOUNTER — Other Ambulatory Visit (HOSPITAL_COMMUNITY)
Admission: RE | Admit: 2018-05-17 | Discharge: 2018-05-17 | Disposition: A | Discharge status: Home / Self Care | Payer: BLUE CROSS/BLUE SHIELD | Source: Ambulatory Visit | Attending: Physician Assistant | Admitting: Physician Assistant

## 2018-05-17 ENCOUNTER — Other Ambulatory Visit: Payer: Self-pay | Admitting: Physician Assistant

## 2018-05-17 DIAGNOSIS — Z0000 Encounter for general adult medical examination without abnormal findings: Secondary | ICD-10-CM | POA: Diagnosis not present

## 2018-05-17 DIAGNOSIS — R42 Dizziness and giddiness: Secondary | ICD-10-CM | POA: Diagnosis not present

## 2018-05-17 DIAGNOSIS — E119 Type 2 diabetes mellitus without complications: Secondary | ICD-10-CM | POA: Diagnosis not present

## 2018-05-17 DIAGNOSIS — E559 Vitamin D deficiency, unspecified: Secondary | ICD-10-CM | POA: Diagnosis not present

## 2018-05-22 LAB — CYTOLOGY - PAP: HPV (WINDOPATH): DETECTED — AB

## 2018-08-02 DIAGNOSIS — R87612 Low grade squamous intraepithelial lesion on cytologic smear of cervix (LGSIL): Secondary | ICD-10-CM | POA: Diagnosis not present

## 2018-08-02 DIAGNOSIS — R87810 Cervical high risk human papillomavirus (HPV) DNA test positive: Secondary | ICD-10-CM | POA: Diagnosis not present

## 2018-08-02 DIAGNOSIS — Z1231 Encounter for screening mammogram for malignant neoplasm of breast: Secondary | ICD-10-CM | POA: Diagnosis not present

## 2018-08-30 ENCOUNTER — Telehealth: Payer: Self-pay | Admitting: *Deleted

## 2018-08-30 NOTE — Telephone Encounter (Signed)
Attempted to contact the patient regarding her new patient appt. Left a message for the patient to call the office back to schedule an appt

## 2018-08-31 ENCOUNTER — Telehealth: Payer: Self-pay | Admitting: *Deleted

## 2018-08-31 NOTE — Telephone Encounter (Signed)
Called and spoke with the patient, scheduled her to see Dr. Stanford Breed on 2/11. Patient given the directions, free valet and pelvic exam.

## 2018-09-04 ENCOUNTER — Inpatient Hospital Stay: Payer: BLUE CROSS/BLUE SHIELD | Attending: Gynecology | Admitting: Gynecology

## 2018-09-04 ENCOUNTER — Encounter: Payer: Self-pay | Admitting: Gynecology

## 2018-09-04 DIAGNOSIS — N891 Moderate vaginal dysplasia: Secondary | ICD-10-CM | POA: Diagnosis not present

## 2018-09-04 MED ORDER — FLUOROURACIL 5 % EX CREA: TOPICAL_CREAM | CUTANEOUS | 1 refills | Status: DC

## 2018-09-04 NOTE — Progress Notes (Signed)
Consult Note: Gyn-Onc   Sandra Hale 58 y.o. female  No chief complaint on file.   Assessment : Multifocal V AIN 2.  Plan: Given the multifocal nature of her vaginal dysplasia, would recommend we treat with intravaginal Efudex.  The schedule would be to apply 1 g at bedtime 4 nights in a row this month and again 4 nights next month we will have her come back then for repeat colposcopy and Pap smear a month later.  The patient is given instruction sheet on how to use intravaginal Efudex safely avoiding vulvitis.  Patient return to see me in 3 months.    HPI: 58 year old seen in consultation requested Dr. Nena Jordan cousins regarding management of newly diagnosed VIN 2.  Patient had a Pap smear on May 17, 2018 showing low-grade squamous intraepithelial lesion.  She subsequently underwent colposcopy which revealed a lesion in the vagina.  Biopsy showed VIN 2.  Endocervical curettage was negative.  The patient gives a remote past history of abnormal Pap smear which she believes was treated with cryotherapy.  She is not on any immunosuppressive drugs.  She has no other significant gynecologic history.  Review of Systems:10 point review of systems is negative except as noted in interval history.   Vitals: Blood pressure 134/69, pulse 97, temperature 99.1 F (37.3 C), temperature source Oral, resp. rate 18, height 5\' 1"  (1.549 m), weight 153 lb (69.4 kg), SpO2 99 %.  Physical Exam: General : The patient is a healthy woman in no acute distress.  HEENT: normocephalic, extraoccular movements normal; neck is supple without thyromegally  Lynphnodes: Supraclavicular and inguinal nodes not enlarged  Abdomen: Soft, non-tender, no ascites, no organomegally, no masses, no hernias  Pelvic:  EGBUS: Normal female  Vagina: Normal, no lesions  Urethra and Bladder: Normal, non-tender  Cervix: Normal  Lower extremities: No edema or varicosities. Normal range of motion  Procedure note: Colposcopic  examination of the cervix and vagina was performed using acetic acid and white light and green filter.  There are several areas in the vagina that are moderately intensely white consistent with dysplasia.  Cervix itself appears normal.     No Known Allergies  History reviewed. No pertinent past medical history.  History reviewed. No pertinent surgical history.  No current outpatient medications on file.   No current facility-administered medications for this visit.     Social History   Socioeconomic History  . Marital status: Single    Spouse name: Not on file  . Number of children: Not on file  . Years of education: Not on file  . Highest education level: Not on file  Occupational History  . Not on file  Social Needs  . Financial resource strain: Not on file  . Food insecurity:    Worry: Not on file    Inability: Not on file  . Transportation needs:    Medical: Not on file    Non-medical: Not on file  Tobacco Use  . Smoking status: Never Smoker  . Smokeless tobacco: Never Used  Substance and Sexual Activity  . Alcohol use: Never    Frequency: Never  . Drug use: Never  . Sexual activity: Not on file  Lifestyle  . Physical activity:    Days per week: Not on file    Minutes per session: Not on file  . Stress: Not on file  Relationships  . Social connections:    Talks on phone: Not on file    Gets together: Not on  file    Attends religious service: Not on file    Active member of club or organization: Not on file    Attends meetings of clubs or organizations: Not on file    Relationship status: Not on file  . Intimate partner violence:    Fear of current or ex partner: Not on file    Emotionally abused: Not on file    Physically abused: Not on file    Forced sexual activity: Not on file  Other Topics Concern  . Not on file  Social History Narrative  . Not on file    Family History  Problem Relation Age of Onset  . Breast cancer Mother   . Diabetes  Mother   . Prostate cancer Father   . Hypertension Sister       De Blanchaniel Clarke-Pearson, MD 09/04/2018, 2:41 PM

## 2018-09-04 NOTE — Patient Instructions (Signed)
See attached Efudex form.  Plan to place the efudex cream in your vagina for four nights in a row then repeat in one month then plan to see Dr. Stanford Breed at the end of the month.

## 2018-09-07 DIAGNOSIS — N6082 Other benign mammary dysplasias of left breast: Secondary | ICD-10-CM | POA: Diagnosis not present

## 2018-10-16 ENCOUNTER — Telehealth: Payer: Self-pay
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
## 2018-10-17 ENCOUNTER — Telehealth: Payer: Self-pay

## 2018-10-17 ENCOUNTER — Other Ambulatory Visit: Payer: Self-pay | Admitting: Gynecologic Oncology

## 2018-10-17 DIAGNOSIS — R102 Pelvic and perineal pain: Secondary | ICD-10-CM

## 2018-10-17 DIAGNOSIS — N9089 Other specified noninflammatory disorders of vulva and perineum: Secondary | ICD-10-CM

## 2018-10-17 MED ORDER — TRAMADOL HCL 50 MG PO TABS: 50.0000 mg | ORAL_TABLET | Freq: Four times a day (QID) | ORAL | 0 refills | Status: DC | PRN

## 2018-10-17 MED ORDER — SILVER SULFADIAZINE 1 % EX CREA: 1.0000 "application " | TOPICAL_CREAM | Freq: Three times a day (TID) | CUTANEOUS | 3 refills | Status: DC

## 2018-10-17 NOTE — Telephone Encounter (Signed)
Ms Sandra Hale states that the pain in the perineum and vagina is worse today. Pain is currently a 7/10. She is having increased difficulty standing and walking. She is most comfortable lying down. She is using the ibuprofen 600 mg alt. with tylenol 500 mg q 6 hours.  She does not feel that this is effective for the pain control. She is using the sitz bath and spritzer for urination.  This is providing more comfort with urination.  Told Ms Sandra Hale that Sandra Mccreedy, NP is going to send in silvadene cream and tramadol for the pain.  She is to apply the cream to the perineum and just slightly inside the vagina 3 times a day.  Pt verbalized understanding.

## 2018-10-17 NOTE — Progress Notes (Signed)
See RN note.  Patient did not douche the efudex cream out of the her vagina after four days of use.  She has significant irritation of the vagina and vulva from the medication.

## 2018-10-18 ENCOUNTER — Other Ambulatory Visit: Payer: Self-pay | Admitting: Gynecologic Oncology

## 2018-10-18 ENCOUNTER — Telehealth: Payer: Self-pay

## 2018-10-18 DIAGNOSIS — N9089 Other specified noninflammatory disorders of vulva and perineum: Secondary | ICD-10-CM

## 2018-10-18 MED ORDER — LIDOCAINE 5 % EX OINT: 1.0000 "application " | TOPICAL_OINTMENT | Freq: Three times a day (TID) | CUTANEOUS | 0 refills | Status: DC | PRN

## 2018-10-18 MED ORDER — LIDOCAINE HCL 2 % EX GEL: 1.0000 "application " | CUTANEOUS | 0 refills | Status: DC | PRN

## 2018-10-18 NOTE — Progress Notes (Signed)
Lidocaine gel for symptom management of vulvar excoriation due to patient leaving efudex cream in the vagina after treatment for four days in a row. Dr. Andrey Farmer and Dr. C-P aware of the situation.  No other interventions at this time.

## 2018-10-18 NOTE — Telephone Encounter (Signed)
Outgoing call to pt per Warner Mccreedy NP- notified pt that Warner Mccreedy NP has spoke with Dr Loree Fee and ordered Lidocaine @ gel to apply to external vaginal areas for pain.  Pt voiced understanding. Pt will contact our office for any worsening symptoms.

## 2018-10-18 NOTE — Telephone Encounter (Signed)
Outgoing call to patient per Warner Mccreedy NP regarding "how she doing?  When does she think she would be able to return to work?  What kind of work does she do, sitting mostly? Is Silvadene cream and/or Tramadol helping?  Pt reports she is "about the same."  Taking Ibuprofen through day and Tramadol at night because it makes her sleepy but not helping much.  Reports main thing that helps is sitz baths. Reports she is using silvadene cream and re-applying after sitz baths.    Reports has light pink drainage on pad and sees white tissue in toilet when she voids.    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  Reports best if she is lying down - most comfortable.   Notified Warner Mccreedy NP and per her, continue what she is doing, because it is just going to take time to heal, Melissa NP will complete her FMLA paperwork to be out this week and next.    I suggested she can try lying with towel under her, without any pad and panties, (described as just like a baby with severe diaper rash) to see if this would help for comfort/pain any .  Pt said she would try this as well.   Encouraged her to continue regimen per Warner Mccreedy NP that was discussed with her yesterday -see Sallye Ober RN notes and call us if any changes or worsening.  Pt voiced understanding. No other needs per pt at this time.

## 2018-11-19 ENCOUNTER — Telehealth: Payer: Self-pay | Admitting: *Deleted

## 2018-11-19 NOTE — Telephone Encounter (Signed)
Called and left the patient a message regarding her appt for 4/285. Left message that her appt was cancelled and moved to 6/9.

## 2018-11-20 ENCOUNTER — Inpatient Hospital Stay: Payer: BLUE CROSS/BLUE SHIELD | Admitting: Gynecology

## 2018-12-10 ENCOUNTER — Telehealth: Payer: Self-pay | Admitting: *Deleted

## 2018-12-10 NOTE — Telephone Encounter (Signed)
Called and spoke with the patient regarding her appt for tomorrow. Patient has no signs/sympotms, has not traveled and has had no exposure to COVID. Explained the new check in process, new parking process and the mask/no visitor policy.  

## 2018-12-11 ENCOUNTER — Inpatient Hospital Stay: Payer: BLUE CROSS/BLUE SHIELD | Attending: Gynecologic Oncology | Admitting: Gynecologic Oncology

## 2018-12-11 ENCOUNTER — Other Ambulatory Visit (HOSPITAL_COMMUNITY)
Admission: RE | Admit: 2018-12-11 | Discharge: 2018-12-11 | Disposition: A | Discharge status: Home / Self Care | Payer: BLUE CROSS/BLUE SHIELD | Source: Ambulatory Visit | Attending: Gynecologic Oncology | Admitting: Gynecologic Oncology

## 2018-12-11 ENCOUNTER — Other Ambulatory Visit: Payer: Self-pay

## 2018-12-11 ENCOUNTER — Encounter: Payer: Self-pay | Admitting: Gynecologic Oncology

## 2018-12-11 ENCOUNTER — Other Ambulatory Visit: Payer: Self-pay | Admitting: Gynecologic Oncology

## 2018-12-11 DIAGNOSIS — N891 Moderate vaginal dysplasia: Secondary | ICD-10-CM

## 2018-12-11 DIAGNOSIS — N893 Dysplasia of vagina, unspecified: Secondary | ICD-10-CM

## 2018-12-11 DIAGNOSIS — N760 Acute vaginitis: Secondary | ICD-10-CM | POA: Diagnosis not present
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
## 2018-12-11 NOTE — Patient Instructions (Signed)
We will call you with the results of your biopsy and Pap smear from today.  It was a pleasure to meet you.

## 2018-12-11 NOTE — Addendum Note (Signed)
Addended by: Laranda Burkemper D on: 12/11/2018 11:43 AM   Modules accepted: Orders  

## 2018-12-11 NOTE — Addendum Note (Signed)
Addended by: Warner Mccreedy D on: 12/11/2018 11:48 AM   Modules accepted: Orders

## 2018-12-12 ENCOUNTER — Telehealth: Payer: Self-pay

## 2018-12-12 NOTE — Telephone Encounter (Signed)
Told Sandra Hale that the biopsy showed densley inflamed fibrous Tissue. No evidence of malignancy per Warner Mccreedy, NP. Follow up in three months with Dr. Stanford Breed. Call the office in midJune to scheduled the appointment.

## 2018-12-13 ENCOUNTER — Telehealth: Payer: Self-pay

## 2018-12-13 LAB — CYTOLOGY - PAP: Diagnosis: NEGATIVE

## 2018-12-13 NOTE — Telephone Encounter (Signed)
Pt notified about pap results: negative.  No questions or concerns voiced. 

## 2019-01-01 ENCOUNTER — Ambulatory Visit: Payer: BLUE CROSS/BLUE SHIELD | Admitting: Gynecology

## 2019-01-18 DIAGNOSIS — Z23 Encounter for immunization: Secondary | ICD-10-CM | POA: Diagnosis not present

## 2019-01-18 DIAGNOSIS — M7581 Other shoulder lesions, right shoulder: Secondary | ICD-10-CM | POA: Diagnosis not present

## 2019-01-18 DIAGNOSIS — Z1211 Encounter for screening for malignant neoplasm of colon: Secondary | ICD-10-CM | POA: Diagnosis not present

## 2019-01-18 DIAGNOSIS — Z6830 Body mass index (BMI) 30.0-30.9, adult: Secondary | ICD-10-CM | POA: Diagnosis not present

## 2019-01-18 DIAGNOSIS — E119 Type 2 diabetes mellitus without complications: Secondary | ICD-10-CM | POA: Diagnosis not present

## 2019-02-08 DIAGNOSIS — M7501 Adhesive capsulitis of right shoulder: Secondary | ICD-10-CM | POA: Diagnosis not present

## 2019-02-08 DIAGNOSIS — M25511 Pain in right shoulder: Secondary | ICD-10-CM | POA: Diagnosis not present

## 2019-02-13 DIAGNOSIS — E119 Type 2 diabetes mellitus without complications: Secondary | ICD-10-CM | POA: Diagnosis not present

## 2019-02-20 DIAGNOSIS — M7501 Adhesive capsulitis of right shoulder: Secondary | ICD-10-CM | POA: Diagnosis not present

## 2019-02-27 DIAGNOSIS — M7501 Adhesive capsulitis of right shoulder: Secondary | ICD-10-CM | POA: Diagnosis not present

## 2019-03-06 DIAGNOSIS — M7501 Adhesive capsulitis of right shoulder: Secondary | ICD-10-CM | POA: Diagnosis not present

## 2019-03-15 DIAGNOSIS — M7501 Adhesive capsulitis of right shoulder: Secondary | ICD-10-CM | POA: Diagnosis not present

## 2019-03-21 DIAGNOSIS — K640 First degree hemorrhoids: Secondary | ICD-10-CM | POA: Diagnosis not present

## 2019-03-21 DIAGNOSIS — Z1211 Encounter for screening for malignant neoplasm of colon: Secondary | ICD-10-CM | POA: Diagnosis not present

## 2019-03-22 DIAGNOSIS — M7501 Adhesive capsulitis of right shoulder: Secondary | ICD-10-CM | POA: Diagnosis not present

## 2019-06-05 DIAGNOSIS — H40023 Open angle with borderline findings, high risk, bilateral: Secondary | ICD-10-CM | POA: Diagnosis not present

## 2019-08-14 DIAGNOSIS — M545 Low back pain: Secondary | ICD-10-CM | POA: Diagnosis not present

## 2019-08-30 DIAGNOSIS — Z124 Encounter for screening for malignant neoplasm of cervix: Secondary | ICD-10-CM | POA: Diagnosis not present

## 2019-08-30 DIAGNOSIS — N891 Moderate vaginal dysplasia: Secondary | ICD-10-CM | POA: Diagnosis not present

## 2019-08-30 DIAGNOSIS — N952 Postmenopausal atrophic vaginitis: Secondary | ICD-10-CM | POA: Diagnosis not present

## 2019-08-30 DIAGNOSIS — Z1231 Encounter for screening mammogram for malignant neoplasm of breast: Secondary | ICD-10-CM | POA: Diagnosis not present

## 2019-08-30 DIAGNOSIS — Z1151 Encounter for screening for human papillomavirus (HPV): Secondary | ICD-10-CM | POA: Diagnosis not present

## 2019-09-04 ENCOUNTER — Encounter: Payer: Self-pay | Admitting: Obstetrics and Gynecology

## 2019-09-06 DIAGNOSIS — E559 Vitamin D deficiency, unspecified: Secondary | ICD-10-CM | POA: Diagnosis not present

## 2019-09-06 DIAGNOSIS — E1169 Type 2 diabetes mellitus with other specified complication: Secondary | ICD-10-CM | POA: Diagnosis not present

## 2019-09-06 DIAGNOSIS — E669 Obesity, unspecified: Secondary | ICD-10-CM | POA: Diagnosis not present

## 2019-09-06 DIAGNOSIS — E7800 Pure hypercholesterolemia, unspecified: Secondary | ICD-10-CM | POA: Diagnosis not present

## 2019-09-06 DIAGNOSIS — Z113 Encounter for screening for infections with a predominantly sexual mode of transmission: Secondary | ICD-10-CM | POA: Diagnosis not present

## 2019-09-06 DIAGNOSIS — Z0000 Encounter for general adult medical examination without abnormal findings: Secondary | ICD-10-CM | POA: Diagnosis not present

## 2019-11-05 DIAGNOSIS — L03313 Cellulitis of chest wall: Secondary | ICD-10-CM | POA: Diagnosis not present

## 2019-11-14 ENCOUNTER — Telehealth: Payer: Self-pay | Admitting: *Deleted

## 2019-11-14 NOTE — Telephone Encounter (Signed)
Called and scheduled the patient for a follow up appt on 5/5

## 2019-11-18 ENCOUNTER — Other Ambulatory Visit: Payer: Self-pay

## 2019-11-22 DIAGNOSIS — L089 Local infection of the skin and subcutaneous tissue, unspecified: Secondary | ICD-10-CM | POA: Diagnosis not present

## 2019-11-22 DIAGNOSIS — L723 Sebaceous cyst: Secondary | ICD-10-CM | POA: Diagnosis not present

## 2019-11-22 DIAGNOSIS — L729 Follicular cyst of the skin and subcutaneous tissue, unspecified: Secondary | ICD-10-CM | POA: Diagnosis not present

## 2019-11-26 NOTE — Assessment & Plan Note (Addendum)
Negative symptom review, normal colposcopic exam s/p ASCUS Pap smear w/positive high-risk HPV co-testing.  >Continue clinical examination every six months for two years and annually thereafter. >Annual cotesting (cytology and HPV). >Repeat colposcopy should be performed for any HPV-positive test or any cytologic abnormality.

## 2019-11-26 NOTE — Progress Notes (Signed)
Follow Up Note: Gyn-Onc  Sandra Hale 59 y.o. female  CC: She presents for a follow-up visit  HPI: She carries a diagnosis of multi-focal VAIN 2 in 10/19.  She has been treated w/Efudex.  At the last visit she underwent colposcopic-directed biopsies and a Pap smear was collected.  The Pap smear showed NILM.  The histology from the biopsy was benign.    Interval History:  After the last visit she had a self-limited episode of pink discharge.  No associated symptoms.  A Pap smear performed by Dr. Maxie Better on 08/30/1999 showed ASCUS.  The high risk HPV cotesting was positive. The patient gives a remote past history of abnormal Pap smear which she believes was treated with cryotherapy.   Review of Systems  Review of Systems  Constitutional: Negative for malaise/fatigue and weight loss.  Genitourinary: Negative for dysuria, frequency, hematuria and urgency.    Current Meds:  Outpatient Encounter Medications as of 11/27/2019  Medication Sig  . doxycycline (VIBRAMYCIN) 100 MG capsule Take 100 mg by mouth 2 (two) times daily.  Marland Kitchen VAGIFEM 10 MCG TABS vaginal tablet INSERT 1 TABLET VAGINALLY DAILY FOR 2 WEEKS THEN 1 TABLET TWICE WEEKLY   No facility-administered encounter medications on file as of 11/27/2019.    Allergy: No Known Allergies  Social Hx:   Social History   Socioeconomic History  . Marital status: Single    Spouse name: Not on file  . Number of children: Not on file  . Years of education: Not on file  . Highest education level: Not on file  Occupational History  . Not on file  Tobacco Use  . Smoking status: Never Smoker  . Smokeless tobacco: Never Used  Substance and Sexual Activity  . Alcohol use: Never  . Drug use: Never  . Sexual activity: Not on file  Other Topics Concern  . Not on file  Social History Narrative  . Not on file   Social Determinants of Health   Financial  Resource Strain:   . Difficulty of Paying Living Expenses:   Food Insecurity:   . Worried About Programme researcher, broadcasting/film/video in the Last Year:   . Barista in the Last Year:   Transportation Needs:   . Freight forwarder (Medical):   Marland Kitchen Lack of Transportation (Non-Medical):   Physical Activity:   . Days of Exercise per Week:   . Minutes of Exercise per Session:   Stress:   . Feeling of Stress :   Social Connections:   . Frequency of Communication with Friends and Family:   . Frequency of Social Gatherings with Friends and Family:   . Attends Religious Services:   . Active Member of Clubs or Organizations:   . Attends Banker Meetings:   Marland Kitchen Marital Status:   Intimate Partner Violence:   . Fear of Current or Ex-Partner:   . Emotionally Abused:   Marland Kitchen Physically Abused:   . Sexually Abused:     Past Surgical Hx: No past surgical history on file.  Past Medical Hx: No past medical history on file.  Family Hx:  Family History  Problem Relation Age of Onset  . Breast cancer Mother   . Diabetes Mother   . Prostate cancer Father   . Hypertension Sister     Vitals:  BP 137/86 (BP Location: Right Arm, Patient Position: Sitting)   Pulse 72   Temp 98.5 F (36.9 C) (Temporal)   Resp 20  Ht 5\' 1"  (1.549 m)   Wt 164 lb (74.4 kg)   SpO2 100%   BMI 30.99 kg/m   Physical Exam:  Physical Exam  Genitourinary:    Vagina normal.     No vaginal discharge.     Genitourinary Comments: Normal EGBUS; posterior fornix mucosa w/mild erythema; no discrete lesion.    Colposcopy Procedure Note  Indications:  See above.  Procedure Details  The risks and benefits of the procedure and Verbal informed consent obtained.  Speculum placed in vagina and excellent visualization of cervix achieved, cervix swabbed x 3 with acetic acid solution.  Findings: Cervix: Unsatisfactory exam, no visible lesions; endocervical curettage performed, and specimen labelled and sent to  pathology. Vaginal inspection: normal without visible lesions.    Specimens: ECC  Complications: none.  Impression: Benign    Assessment/Plan:   VAIN (vaginal intraepithelial neoplasia) Negative symptom review, normal colposcopic exam s/p ASCUS Pap smear w/positive high-risk HPV co-testing.  >Continue clinical examination every six months for two years and annually thereafter. >Annual cotesting (cytology and HPV). >Repeat colposcopy should be performed for any HPV-positive test or any cytologic abnormality.      ASCUS with positive high risk HPV cervical Negative colposcopic exam  >review the histology from the [facility] >further surveillance per ASCCP risk-based management guidelines  I personally spent 25 minutes face-to-face and non-face-to-face in the care of this patient, which includes all pre, intra, and post visit time on the date of service.   Lahoma Crocker, MD 11/26/2019, 9:07 PM

## 2019-11-27 ENCOUNTER — Inpatient Hospital Stay: Payer: BC Managed Care – PPO | Attending: Obstetrics & Gynecology | Admitting: Obstetrics & Gynecology

## 2019-11-27 ENCOUNTER — Encounter: Payer: Self-pay | Admitting: Obstetrics & Gynecology

## 2019-11-27 ENCOUNTER — Other Ambulatory Visit: Payer: Self-pay

## 2019-11-27 VITALS — BP 137/86 | HR 72 | Temp 98.5°F | Resp 20 | Ht 61.0 in | Wt 164.0 lb

## 2019-11-27 DIAGNOSIS — N893 Dysplasia of vagina, unspecified: Secondary | ICD-10-CM | POA: Diagnosis not present

## 2019-11-27 DIAGNOSIS — R87610 Atypical squamous cells of undetermined significance on cytologic smear of cervix (ASC-US): Secondary | ICD-10-CM | POA: Diagnosis not present

## 2019-11-27 DIAGNOSIS — R87810 Cervical high risk human papillomavirus (HPV) DNA test positive: Secondary | ICD-10-CM | POA: Diagnosis not present

## 2019-11-27 DIAGNOSIS — N870 Mild cervical dysplasia: Secondary | ICD-10-CM | POA: Diagnosis not present

## 2019-11-27 DIAGNOSIS — N891 Moderate vaginal dysplasia: Secondary | ICD-10-CM | POA: Diagnosis not present

## 2019-11-27 NOTE — Assessment & Plan Note (Signed)
Negative colposcopic exam  >review the histology from the [facility] >further surveillance per ASCCP risk-based management guidelines

## 2019-11-27 NOTE — Patient Instructions (Addendum)
Follow up in 6 months 

## 2019-11-29 LAB — SURGICAL PATHOLOGY

## 2019-12-04 DIAGNOSIS — E119 Type 2 diabetes mellitus without complications: Secondary | ICD-10-CM | POA: Diagnosis not present

## 2019-12-04 DIAGNOSIS — H40023 Open angle with borderline findings, high risk, bilateral: Secondary | ICD-10-CM | POA: Diagnosis not present

## 2019-12-06 ENCOUNTER — Telehealth: Payer: Self-pay

## 2019-12-06 DIAGNOSIS — L089 Local infection of the skin and subcutaneous tissue, unspecified: Secondary | ICD-10-CM | POA: Diagnosis not present

## 2019-12-06 DIAGNOSIS — L723 Sebaceous cyst: Secondary | ICD-10-CM | POA: Diagnosis not present

## 2019-12-06 NOTE — Telephone Encounter (Signed)
LM for Sandra Hale to call the office to discuss the pathology results.

## 2019-12-06 NOTE — Telephone Encounter (Addendum)
Told Sandra Hale that the [facility] showed her cervix with low grade changes. Continue to follow up with Dr. Tamela Oddi and Dr. Cherly Hensen.  Repeat pap with HPV in 1 year per Melissa Cross,NP.  Told her that the Pap smear results were not back. Will call her with these results as well

## 2019-12-10 NOTE — Telephone Encounter (Signed)
LM for Sandra Hale stating that the pap smear specimen was not sent to pathology when she was here on 11-27-19 as she had one done in January with gyn.  If she has any questions or concerns, she can call the office at [phone].

## 2020-02-15 DIAGNOSIS — Z20822 Contact with and (suspected) exposure to covid-19: Secondary | ICD-10-CM | POA: Diagnosis not present

## 2020-04-24 DIAGNOSIS — E1169 Type 2 diabetes mellitus with other specified complication: Secondary | ICD-10-CM | POA: Diagnosis not present

## 2020-04-24 DIAGNOSIS — E559 Vitamin D deficiency, unspecified: Secondary | ICD-10-CM | POA: Diagnosis not present

## 2020-04-24 DIAGNOSIS — E7800 Pure hypercholesterolemia, unspecified: Secondary | ICD-10-CM | POA: Diagnosis not present

## 2020-05-26 NOTE — Assessment & Plan Note (Signed)
Negative symptom review, normal exam  >return for clinical exam in 6 mths

## 2020-05-26 NOTE — Progress Notes (Signed)
Follow Up Note: Gyn-Onc  Sandra Hale 59 y.o. female  CC: She presents for a follow-up visit  HPI: She carries a diagnosis of multi-focal VAIN 2 in 10/19.  She has been treated w/Efudex.  At the last visit she underwent colposcopic-directed biopsies and a Pap smear was collected.  The Pap smear showed NILM.  The histology from the biopsy was benign.    Interval History:  No complaints at today's visit.   A Pap smear performed by Dr. Maxie Better on 08/30/1999 showed ASCUS.  The high risk HPV cotesting was positive. The patient gives a remote past history of abnormal Pap smear which she believes was treated with cryotherapy. At the last visit a colposcopic exam was unremarkable.  The histology from the [facility] showed CIN 1.    Review of Systems  Review of Systems  Constitutional: Negative for malaise/fatigue and weight loss.  Genitourinary: Negative for dysuria, frequency, hematuria and urgency.    Current Meds:  Outpatient Encounter Medications as of 05/27/2020  Medication Sig  . rosuvastatin (CRESTOR) 20 MG tablet Take 1 tablet by mouth once a week.  Marland Kitchen VAGIFEM 10 MCG TABS vaginal tablet INSERT 1 TABLET VAGINALLY DAILY FOR 2 WEEKS THEN 1 TABLET TWICE WEEKLY  . [DISCONTINUED] doxycycline (VIBRAMYCIN) 100 MG capsule Take 100 mg by mouth 2 (two) times daily.   No facility-administered encounter medications on file as of 05/27/2020.    Allergy: No Known Allergies  Social Hx:   Social History   Socioeconomic History  . Marital status: Single    Spouse name: Not on file  . Number of children: Not on file  . Years of education: Not on file  . Highest education level: Not on file  Occupational History  . Not on file  Tobacco Use  . Smoking status: Never Smoker  . Smokeless tobacco: Never Used  Vaping Use  . Vaping Use: Never used  Substance and Sexual Activity  . Alcohol use: Never  . Drug use:  Never  . Sexual activity: Not on file  Other Topics Concern  . Not on file  Social History Narrative  . Not on file   Social Determinants of Health   Financial Resource Strain:   . Difficulty of Paying Living Expenses: Not on file  Food Insecurity:   . Worried About Programme researcher, broadcasting/film/video in the Last Year: Not on file  . Ran Out of Food in the Last Year: Not on file  Transportation Needs:   . Lack of Transportation (Medical): Not on file  . Lack of Transportation (Non-Medical): Not on file  Physical Activity:   . Days of Exercise per Week: Not on file  . Minutes of Exercise per Session: Not on file  Stress:   . Feeling of Stress : Not on file  Social Connections:   . Frequency of Communication with Friends and Family: Not on file  . Frequency of Social Gatherings with Friends and Family: Not on file  . Attends Religious Services: Not on file  . Active Member of Clubs or Organizations: Not on file  . Attends Banker Meetings: Not on file  . Marital Status: Not on file  Intimate Partner Violence:   . Fear of Current or Ex-Partner: Not on file  . Emotionally Abused: Not on file  . Physically Abused: Not on file  . Sexually Abused: Not on file    Past Surgical Hx: History reviewed. No pertinent surgical history.  Past Medical Hx: History reviewed.  No pertinent past medical history.  Family Hx:  Family History  Problem Relation Age of Onset  . Breast cancer Mother   . Diabetes Mother   . Prostate cancer Father   . Hypertension Sister     Vitals:  BP 130/80 (BP Location: Left Arm, Patient Position: Sitting)   Pulse 82   Temp (!) 96.5 F (35.8 C) (Tympanic)   Resp 18   Wt 167 lb 6.4 oz (75.9 kg)   SpO2 100%   BMI 31.63 kg/m  Physical Exam:  Physical Exam Genitourinary:    General: Normal vulva.     Vagina: Normal. No vaginal discharge.       Assessment/Plan:  ASCUS with positive high risk HPV cervical CIN 1  >repeat Pap/HrHPV co-testing in 1  yr per ASCCP risk-based management guidelines  VAIN (vaginal intraepithelial neoplasia) Negative symptom review, normal exam  >return for clinical exam in 6 mths    I personally spent 25 minutes face-to-face and non-face-to-face in the care of this patient, which includes all pre, intra, and post visit time on the date of service.   Antionette Char, MD 05/27/2020, 11:03 AM

## 2020-05-26 NOTE — Assessment & Plan Note (Addendum)
CIN 1  >repeat Pap/HrHPV co-testing in 1 yr per ASCCP risk-based management guidelines

## 2020-05-27 ENCOUNTER — Inpatient Hospital Stay: Payer: BC Managed Care – PPO | Attending: Obstetrics & Gynecology | Admitting: Obstetrics & Gynecology

## 2020-05-27 ENCOUNTER — Other Ambulatory Visit: Payer: Self-pay

## 2020-05-27 ENCOUNTER — Encounter: Payer: Self-pay | Admitting: Obstetrics & Gynecology

## 2020-05-27 DIAGNOSIS — R87610 Atypical squamous cells of undetermined significance on cytologic smear of cervix (ASC-US): Secondary | ICD-10-CM | POA: Diagnosis not present

## 2020-05-27 DIAGNOSIS — R87810 Cervical high risk human papillomavirus (HPV) DNA test positive: Secondary | ICD-10-CM

## 2020-05-27 DIAGNOSIS — N870 Mild cervical dysplasia: Secondary | ICD-10-CM | POA: Diagnosis not present

## 2020-05-27 DIAGNOSIS — Z79899 Other long term (current) drug therapy: Secondary | ICD-10-CM | POA: Insufficient documentation

## 2020-05-27 DIAGNOSIS — N893 Dysplasia of vagina, unspecified: Secondary | ICD-10-CM | POA: Diagnosis not present

## 2020-05-27 NOTE — Patient Instructions (Signed)
Follow up with Dr. Tamela Oddi in 6 months for exam with a Pap Smear. Please call the office [phone] in January or February of 2022 to schedule your appointment for May of 2022.

## 2020-06-03 DIAGNOSIS — H40023 Open angle with borderline findings, high risk, bilateral: Secondary | ICD-10-CM | POA: Diagnosis not present

## 2020-09-18 DIAGNOSIS — E1169 Type 2 diabetes mellitus with other specified complication: Secondary | ICD-10-CM | POA: Diagnosis not present

## 2020-09-18 DIAGNOSIS — E559 Vitamin D deficiency, unspecified: Secondary | ICD-10-CM | POA: Diagnosis not present

## 2020-09-18 DIAGNOSIS — Z0000 Encounter for general adult medical examination without abnormal findings: Secondary | ICD-10-CM | POA: Diagnosis not present

## 2020-09-18 DIAGNOSIS — L259 Unspecified contact dermatitis, unspecified cause: Secondary | ICD-10-CM | POA: Diagnosis not present

## 2020-09-18 DIAGNOSIS — E7800 Pure hypercholesterolemia, unspecified: Secondary | ICD-10-CM | POA: Diagnosis not present

## 2020-09-22 ENCOUNTER — Other Ambulatory Visit: Payer: Self-pay | Admitting: Family Medicine

## 2020-09-22 DIAGNOSIS — Z1231 Encounter for screening mammogram for malignant neoplasm of breast: Secondary | ICD-10-CM

## 2020-12-10 ENCOUNTER — Ambulatory Visit (HOSPITAL_BASED_OUTPATIENT_CLINIC_OR_DEPARTMENT_OTHER): Payer: BC Managed Care – PPO | Admitting: Radiology

## 2020-12-14 ENCOUNTER — Telehealth: Payer: Self-pay | Admitting: *Deleted

## 2020-12-14 NOTE — Telephone Encounter (Signed)
Called and left the patient a message to call the office back. Patient needs to be reschedule from 5/25 to 6/1

## 2020-12-14 NOTE — Telephone Encounter (Signed)
Patient returned call and appt was moved to 7/6

## 2020-12-16 ENCOUNTER — Inpatient Hospital Stay: Payer: BC Managed Care – PPO | Admitting: Obstetrics & Gynecology

## 2021-01-26 ENCOUNTER — Encounter: Payer: Self-pay | Admitting: Obstetrics & Gynecology

## 2021-01-26 NOTE — Progress Notes (Signed)
Follow Up Note: Gyn-Onc  Sandra Hale 60 y.o. female  CC: She presents for a follow-up visit  HPI: She carries a diagnosis of multi-focal VAIN 2 in 10/19.  She has been treated w/Efudex.  At the last visit she underwent colposcopic-directed biopsies and a Pap smear was collected.  The Pap smear showed NILM.  The histology from the biopsy was benign.    Interval History:  No complaints at today's visit.   A Pap smear performed by Dr. Servando Salina on 08/30/1999 showed ASCUS.  The high risk HPV cotesting was positive. The patient gives a remote past history of abnormal Pap smear which she believes was treated with cryotherapy. At the last visit a colposcopic exam was unremarkable.  The histology from the [facility] showed CIN 1.    Review of Systems  Review of Systems  Constitutional:  Negative for malaise/fatigue and weight loss.  Genitourinary:  Negative for dysuria, frequency, hematuria and urgency.   Current Meds:  Outpatient Encounter Medications as of 01/27/2021  Medication Sig   Cyanocobalamin (B-12 COMPLIANCE INJECTION) 1000 MCG/ML KIT Inject 1,000 mcg as directed every 30 (thirty) days.   hydrocortisone 2.5 % cream Apply topically daily as needed.   rosuvastatin (CRESTOR) 20 MG tablet Take 1 tablet by mouth once a week.   [DISCONTINUED] VAGIFEM 10 MCG TABS vaginal tablet INSERT 1 TABLET VAGINALLY DAILY FOR 2 WEEKS THEN 1 TABLET TWICE WEEKLY   No facility-administered encounter medications on file as of 01/27/2021.    Allergy: No Known Allergies  Social Hx:   Social History   Socioeconomic History   Marital status: Single    Spouse name: Not on file   Number of children: Not on file   Years of education: Not on file   Highest education level: Not on file  Occupational History   Not on file  Tobacco Use   Smoking status: Never   Smokeless tobacco: Never  Vaping Use   Vaping Use: Never used   Substance and Sexual Activity   Alcohol use: Never   Drug use: Never   Sexual activity: Not on file  Other Topics Concern   Not on file  Social History Narrative   Not on file   Social Determinants of Health   Financial Resource Strain: Not on file  Food Insecurity: Not on file  Transportation Needs: Not on file  Physical Activity: Not on file  Stress: Not on file  Social Connections: Not on file  Intimate Partner Violence: Not on file    Past Surgical Hx: History reviewed. No pertinent surgical history.  Past Medical Hx: History reviewed. No pertinent past medical history.  Family Hx:  Family History  Problem Relation Age of Onset   Breast cancer Mother    Diabetes Mother    Prostate cancer Father    Hypertension Sister     Vitals:  BP 139/76 (BP Location: Left Arm, Patient Position: Sitting)   Pulse 79   Temp 98 F (36.7 C) (Oral)   Resp 14   Ht _0  (1.549 m)   Wt 167 lb 4.8 oz (75.9 kg)   SpO2 100%   BMI 31.61 kg/m   Physical Exam:  Physical Exam Genitourinary:    General: Normal vulva.     Vagina: Normal. No vaginal discharge.     Cervix: Lesions: stenotic external os.      Assessment/Plan:   VAIN (vaginal intraepithelial neoplasia) Unremarkable exam  Clinical examination annually. Annual co-testing (cytology and HPV)  Repeat  colposcopy  for HPV-positive test or any cytologic abnormality.   CIN 1     I personally spent 25 minutes face-to-face and non-face-to-face in the care of this patient, which includes all pre, intra, and post visit time on the date of service.   Lahoma Crocker, MD 01/26/2021, 8:46 PM

## 2021-01-26 NOTE — Assessment & Plan Note (Addendum)
Unremarkable exam  Clinical examination annually. Annual co-testing (cytology and HPV)  Repeat colposcopy  forHPV-positive test or any cytologic abnormality.

## 2021-01-26 NOTE — Assessment & Plan Note (Signed)
CIN 1  >review the Pap/HPV co-test result; further evaluation/surveillance per ASCCP risk-based recommendations

## 2021-01-27 ENCOUNTER — Other Ambulatory Visit: Payer: Self-pay

## 2021-01-27 ENCOUNTER — Encounter: Payer: Self-pay | Admitting: Obstetrics & Gynecology

## 2021-01-27 ENCOUNTER — Other Ambulatory Visit (HOSPITAL_COMMUNITY)
Admission: RE | Admit: 2021-01-27 | Discharge: 2021-01-27 | Disposition: A | Discharge status: Home / Self Care | Payer: 59 | Source: Ambulatory Visit | Attending: Obstetrics & Gynecology | Admitting: Obstetrics & Gynecology

## 2021-01-27 ENCOUNTER — Inpatient Hospital Stay: Payer: 59 | Attending: Obstetrics & Gynecology | Admitting: Obstetrics & Gynecology

## 2021-01-27 VITALS — BP 139/76 | HR 79 | Temp 98.0°F | Resp 14 | Ht 61.0 in | Wt 167.3 lb

## 2021-01-27 DIAGNOSIS — N893 Dysplasia of vagina, unspecified: Secondary | ICD-10-CM | POA: Insufficient documentation

## 2021-01-27 DIAGNOSIS — R87610 Atypical squamous cells of undetermined significance on cytologic smear of cervix (ASC-US): Secondary | ICD-10-CM | POA: Insufficient documentation

## 2021-01-27 DIAGNOSIS — R87810 Cervical high risk human papillomavirus (HPV) DNA test positive: Secondary | ICD-10-CM | POA: Insufficient documentation

## 2021-01-27 DIAGNOSIS — N891 Moderate vaginal dysplasia: Secondary | ICD-10-CM | POA: Insufficient documentation

## 2021-01-27 DIAGNOSIS — N870 Mild cervical dysplasia: Secondary | ICD-10-CM | POA: Diagnosis not present

## 2021-01-27 NOTE — Patient Instructions (Addendum)
Return in 1 year  Please call in May to schedule your appointment for July of 2023.

## 2021-02-02 LAB — CYTOLOGY - PAP
Adequacy: ABSENT
Comment: NEGATIVE
Diagnosis: NEGATIVE
High risk HPV: NEGATIVE

## 2021-02-03 ENCOUNTER — Telehealth: Payer: Self-pay

## 2021-02-03 NOTE — Telephone Encounter (Signed)
Told Sandra Hale that her pap smear was normal.  It was HPV high risk negative. Pt verbalized understanding.

## 2021-04-12 ENCOUNTER — Ambulatory Visit: Payer: 59 | Admitting: Podiatry

## 2021-04-28 ENCOUNTER — Ambulatory Visit: Payer: 59 | Admitting: Podiatry

## 2021-05-06 ENCOUNTER — Ambulatory Visit (INDEPENDENT_AMBULATORY_CARE_PROVIDER_SITE_OTHER): Payer: 59 | Admitting: Podiatry

## 2021-05-06 ENCOUNTER — Other Ambulatory Visit: Payer: Self-pay

## 2021-05-06 ENCOUNTER — Ambulatory Visit (INDEPENDENT_AMBULATORY_CARE_PROVIDER_SITE_OTHER): Payer: 59

## 2021-05-06 VITALS — BP 129/71 | HR 78 | Temp 98.4°F

## 2021-05-06 DIAGNOSIS — M2141 Flat foot [pes planus] (acquired), right foot: Secondary | ICD-10-CM | POA: Diagnosis not present

## 2021-05-06 DIAGNOSIS — M722 Plantar fascial fibromatosis: Secondary | ICD-10-CM | POA: Diagnosis not present

## 2021-05-06 DIAGNOSIS — M2142 Flat foot [pes planus] (acquired), left foot: Secondary | ICD-10-CM | POA: Diagnosis not present

## 2021-05-06 MED ORDER — MELOXICAM 15 MG PO TABS: 15.0000 mg | ORAL_TABLET | Freq: Every day | ORAL | 0 refills | Status: DC | PRN

## 2021-05-06 NOTE — Patient Instructions (Signed)
Look at getting a "powerstep" or "superfeet" insert   Plantar Fasciitis (Heel Spur Syndrome) with Rehab The plantar fascia is a fibrous, ligament-like, soft-tissue structure that spans the bottom of the foot. Plantar fasciitis is a condition that causes pain in the foot due to inflammation of the tissue. SYMPTOMS  Pain and tenderness on the underneath side of the foot. Pain that worsens with standing or walking. CAUSES  Plantar fasciitis is caused by irritation and injury to the plantar fascia on the underneath side of the foot. Common mechanisms of injury include: Direct trauma to bottom of the foot. Damage to a small nerve that runs under the foot where the main fascia attaches to the heel bone. Stress placed on the plantar fascia due to bone spurs. RISK INCREASES WITH:  Activities that place stress on the plantar fascia (running, jumping, pivoting, or cutting). Poor strength and flexibility. Improperly fitted shoes. Tight calf muscles. Flat feet. Failure to warm-up properly before activity. Obesity. PREVENTION Warm up and stretch properly before activity. Allow for adequate recovery between workouts. Maintain physical fitness: Strength, flexibility, and endurance. Cardiovascular fitness. Maintain a health body weight. Avoid stress on the plantar fascia. Wear properly fitted shoes, including arch supports for individuals who have flat feet.  PROGNOSIS  If treated properly, then the symptoms of plantar fasciitis usually resolve without surgery. However, occasionally surgery is necessary.  RELATED COMPLICATIONS  Recurrent symptoms that may result in a chronic condition. Problems of the lower back that are caused by compensating for the injury, such as limping. Pain or weakness of the foot during push-off following surgery. Chronic inflammation, scarring, and partial or complete fascia tear, occurring more often from repeated injections.  TREATMENT  Treatment initially  involves the use of ice and medication to help reduce pain and inflammation. The use of strengthening and stretching exercises may help reduce pain with activity, especially stretches of the Achilles tendon. These exercises may be performed at home or with a therapist. Your caregiver may recommend that you use heel cups of arch supports to help reduce stress on the plantar fascia. Occasionally, corticosteroid injections are given to reduce inflammation. If symptoms persist for greater than 6 months despite non-surgical (conservative), then surgery may be recommended.   MEDICATION  If pain medication is necessary, then nonsteroidal anti-inflammatory medications, such as aspirin and ibuprofen, or other minor pain relievers, such as acetaminophen, are often recommended. Do not take pain medication within 7 days before surgery. Prescription pain relievers may be given if deemed necessary by your caregiver. Use only as directed and only as much as you need. Corticosteroid injections may be given by your caregiver. These injections should be reserved for the most serious cases, because they may only be given a certain number of times.  HEAT AND COLD Cold treatment (icing) relieves pain and reduces inflammation. Cold treatment should be applied for 10 to 15 minutes every 2 to 3 hours for inflammation and pain and immediately after any activity that aggravates your symptoms. Use ice packs or massage the area with a piece of ice (ice massage). Heat treatment may be used prior to performing the stretching and strengthening activities prescribed by your caregiver, physical therapist, or athletic trainer. Use a heat pack or soak the injury in warm water.  SEEK IMMEDIATE MEDICAL CARE IF: Treatment seems to offer no benefit, or the condition worsens. Any medications produce adverse side effects.  EXERCISES- RANGE OF MOTION (ROM) AND STRETCHING EXERCISES - Plantar Fasciitis (Heel Spur Syndrome) These exercises  may help you when beginning to rehabilitate your injury. Your symptoms may resolve with or without further involvement from your physician, physical therapist or athletic trainer. While completing these exercises, remember:  Restoring tissue flexibility helps normal motion to return to the joints. This allows healthier, less painful movement and activity. An effective stretch should be held for at least 30 seconds. A stretch should never be painful. You should only feel a gentle lengthening or release in the stretched tissue.  RANGE OF MOTION - Toe Extension, Flexion Sit with your right / left leg crossed over your opposite knee. Grasp your toes and gently pull them back toward the top of your foot. You should feel a stretch on the bottom of your toes and/or foot. Hold this stretch for 10 seconds. Now, gently pull your toes toward the bottom of your foot. You should feel a stretch on the top of your toes and or foot. Hold this stretch for 10 seconds. Repeat  times. Complete this stretch 3 times per day.   RANGE OF MOTION - Ankle Dorsiflexion, Active Assisted Remove shoes and sit on a chair that is preferably not on a carpeted surface. Place right / left foot under knee. Extend your opposite leg for support. Keeping your heel down, slide your right / left foot back toward the chair until you feel a stretch at your ankle or calf. If you do not feel a stretch, slide your bottom forward to the edge of the chair, while still keeping your heel down. Hold this stretch for 10 seconds. Repeat 3 times. Complete this stretch 2 times per day.   STRETCH  Gastroc, Standing Place hands on wall. Extend right / left leg, keeping the front knee somewhat bent. Slightly point your toes inward on your back foot. Keeping your right / left heel on the floor and your knee straight, shift your weight toward the wall, not allowing your back to arch. You should feel a gentle stretch in the right / left calf. Hold this  position for 10 seconds. Repeat 3 times. Complete this stretch 2 times per day.  STRETCH  Soleus, Standing Place hands on wall. Extend right / left leg, keeping the other knee somewhat bent. Slightly point your toes inward on your back foot. Keep your right / left heel on the floor, bend your back knee, and slightly shift your weight over the back leg so that you feel a gentle stretch deep in your back calf. Hold this position for 10 seconds. Repeat 3 times. Complete this stretch 2 times per day.  STRETCH  Gastrocsoleus, Standing  Note: This exercise can place a lot of stress on your foot and ankle. Please complete this exercise only if specifically instructed by your caregiver.  Place the ball of your right / left foot on a step, keeping your other foot firmly on the same step. Hold on to the wall or a rail for balance. Slowly lift your other foot, allowing your body weight to press your heel down over the edge of the step. You should feel a stretch in your right / left calf. Hold this position for 10 seconds. Repeat this exercise with a slight bend in your right / left knee. Repeat 3 times. Complete this stretch 2 times per day.   STRENGTHENING EXERCISES - Plantar Fasciitis (Heel Spur Syndrome)  These exercises may help you when beginning to rehabilitate your injury. They may resolve your symptoms with or without further involvement from your physician, physical therapist or  Product/process development scientist. While completing these exercises, remember:  Muscles can gain both the endurance and the strength needed for everyday activities through controlled exercises. Complete these exercises as instructed by your physician, physical therapist or athletic trainer. Progress the resistance and repetitions only as guided.  STRENGTH - Towel Curls Sit in a chair positioned on a non-carpeted surface. Place your foot on a towel, keeping your heel on the floor. Pull the towel toward your heel by only curling your  toes. Keep your heel on the floor. Repeat 3 times. Complete this exercise 2 times per day.  STRENGTH - Ankle Inversion Secure one end of a rubber exercise band/tubing to a fixed object (table, pole). Loop the other end around your foot just before your toes. Place your fists between your knees. This will focus your strengthening at your ankle. Slowly, pull your big toe up and in, making sure the band/tubing is positioned to resist the entire motion. Hold this position for 10 seconds. Have your muscles resist the band/tubing as it slowly pulls your foot back to the starting position. Repeat 3 times. Complete this exercises 2 times per day.  Document Released: 07/11/2005 Document Revised: 10/03/2011 Document Reviewed: 10/23/2008 [facility] Patient Information 2014 [place], [place].

## 2021-05-06 NOTE — Progress Notes (Signed)
Subjective:   Patient ID: Sandra Hale, female   DOB: 60 y.o.   MRN: [id number]   HPI 60 year old female presents the office with concerns of right heel pain which started in July 2022.  She states that it started after she was driving long distance.  She said the heel hurts when there is pressure applied to the area.  The pain does not occur every day.  No injury that she reports.  No other concerns today.   Review of Systems  All other systems reviewed and are negative.  No past medical history on file.  No past surgical history on file.   Current Outpatient Medications:    meloxicam (MOBIC) 15 MG tablet, Take 1 tablet (15 mg total) by mouth daily as needed for pain., Disp: 30 tablet, Rfl: 0   Cyanocobalamin (B-12 COMPLIANCE INJECTION) 1000 MCG/ML KIT, Inject 1,000 mcg as directed every 30 (thirty) days., Disp: , Rfl:    hydrocortisone 2.5 % cream, Apply topically daily as needed., Disp: , Rfl:    rosuvastatin (CRESTOR) 20 MG tablet, Take 1 tablet by mouth once a week., Disp: , Rfl:   No Known Allergies        Objective:  Physical Exam  General: AAO x3, NAD  Dermatological: Skin is warm, dry and supple bilateral. There are no open sores, no preulcerative lesions, no rash or signs of infection present.  Vascular: Dorsalis Pedis artery and Posterior Tibial artery pedal pulses are 2/4 bilateral with immedate capillary fill time. There is no pain with calf compression, swelling, warmth, erythema.   Neruologic: Grossly intact via light touch bilateral. Negative tinel sign.   Musculoskeletal: Majority discomfort she experiences to the posterior plantar aspect of the right heel.  Not able to elicit significant tenderness today but mild tenderness is present on insertion of plantar fascial.  No pain about compression of calcaneus.  No significant pain to the posterior calcaneus.  No pain Achilles tendon.  Flexor, extensor tendons are intact.  Decreased medial arch upon weightbearing.   Muscular strength 5/5 in all groups tested bilateral.  Gait: Unassisted, Nonantalgic.       Assessment:   Plantar fasciitis right side     Plan:  -Treatment options discussed including all alternatives, risks, and complications -Etiology of symptoms were discussed -X-rays were obtained and reviewed with the patient.  Posterior heel spurs present.  No evidence of acute fracture.  Decreased calcaneal inclination angle. -As she is not having significant discomfort we held off on steroid injection.  Prescribed meloxicam discussed side effects.  Discussed shoe modifications and arch supports.  Discussed serious over-the-counter inserts that she can try as well.  Encourage stretching, icing daily.  Trula Slade DPM

## 2021-12-14 ENCOUNTER — Encounter: Payer: Self-pay | Admitting: Obstetrics & Gynecology

## 2021-12-15 ENCOUNTER — Other Ambulatory Visit (HOSPITAL_COMMUNITY)
Admission: RE | Admit: 2021-12-15 | Discharge: 2021-12-15 | Disposition: A | Discharge status: Home / Self Care | Payer: Commercial Managed Care - PPO | Source: Ambulatory Visit | Attending: Obstetrics & Gynecology | Admitting: Obstetrics & Gynecology

## 2021-12-15 ENCOUNTER — Other Ambulatory Visit: Payer: Self-pay

## 2021-12-15 ENCOUNTER — Encounter: Payer: Self-pay | Admitting: Obstetrics & Gynecology

## 2021-12-15 ENCOUNTER — Inpatient Hospital Stay: Payer: Commercial Managed Care - PPO | Attending: Obstetrics & Gynecology | Admitting: Obstetrics & Gynecology

## 2021-12-15 VITALS — BP 110/65 | HR 66 | Temp 98.1°F | Resp 16 | Ht 62.0 in | Wt 154.3 lb

## 2021-12-15 DIAGNOSIS — Z1151 Encounter for screening for human papillomavirus (HPV): Secondary | ICD-10-CM | POA: Insufficient documentation

## 2021-12-15 DIAGNOSIS — N893 Dysplasia of vagina, unspecified: Secondary | ICD-10-CM | POA: Insufficient documentation

## 2021-12-15 DIAGNOSIS — R87610 Atypical squamous cells of undetermined significance on cytologic smear of cervix (ASC-US): Secondary | ICD-10-CM | POA: Insufficient documentation

## 2021-12-15 DIAGNOSIS — R87810 Cervical high risk human papillomavirus (HPV) DNA test positive: Secondary | ICD-10-CM | POA: Insufficient documentation

## 2021-12-15 NOTE — Assessment & Plan Note (Addendum)
Unremarkable exam  Clinical examination annually. Annual co-testing (cytology and HPV)  Repeat colposcopy  forHPV-positive test or any cytologic abnormality.    

## 2021-12-15 NOTE — Progress Notes (Signed)
Follow Up Note: Gyn-Onc  Sandra Hale 61 y.o. female  CC: She presents for a follow-up visit  HPI: She carries a diagnosis of multi-focal VAIN 2 in 10/19.  She has been treated w/Efudex.  At the last visit she underwent colposcopic-directed biopsies and a Pap smear was collected.  The Pap smear showed NILM.  The histology from the biopsy was benign.   A Pap smear performed by Dr. Servando Salina on 08/30/1999 showed ASCUS.  The high risk HPV cotesting was positive. The patient gives a remote past history of abnormal Pap smear which she believes was treated with cryotherapy. At the last visit a colposcopic exam was unremarkable.  The histology from the [facility] showed CIN 1.   A Pap in 7/22 showed NILM/HR-HPV neg.  Interval History:  No complaints at today's visit.     Review of Systems  Review of Systems  Constitutional:  Negative for malaise/fatigue and weight loss.  Genitourinary:  Negative for dysuria, frequency, hematuria and urgency.   Current Meds:  Outpatient Encounter Medications as of 12/15/2021  Medication Sig   Blood Glucose Monitoring Suppl (ACCU-CHEK GUIDE) w/Device KIT See admin instructions.   Cholecalciferol (VITAMIN D) 50 MCG (2000 UT) tablet 1 tablet   glucose blood (ACCU-CHEK GUIDE) test strip See admin instructions.   hydrocortisone 2.5 % cream Apply topically daily as needed.   rosuvastatin (CRESTOR) 20 MG tablet Take 1 tablet by mouth once a week.   [DISCONTINUED] Cyanocobalamin (B-12 COMPLIANCE INJECTION) 1000 MCG/ML KIT Inject 1,000 mcg as directed every 30 (thirty) days.   [DISCONTINUED] meloxicam (MOBIC) 15 MG tablet Take 1 tablet (15 mg total) by mouth daily as needed for pain.   No facility-administered encounter medications on file as of 12/15/2021.    Allergy: No Known Allergies  Social Hx:   Social History   Socioeconomic History   Marital status: Single    Spouse name:  Not on file   Number of children: Not on file   Years of education: Not on file   Highest education level: Not on file  Occupational History   Not on file  Tobacco Use   Smoking status: Never   Smokeless tobacco: Never  Vaping Use   Vaping Use: Never used  Substance and Sexual Activity   Alcohol use: Never   Drug use: Never   Sexual activity: Not Currently  Other Topics Concern   Not on file  Social History Narrative   Not on file   Social Determinants of Health   Financial Resource Strain: Not on file  Food Insecurity: Not on file  Transportation Needs: Not on file  Physical Activity: Not on file  Stress: Not on file  Social Connections: Not on file  Intimate Partner Violence: Not on file    Past Surgical Hx: History reviewed. No pertinent surgical history.  Past Medical Hx: History reviewed. No pertinent past medical history.  Family Hx:  Family History  Problem Relation Age of Onset   Breast cancer Mother    Diabetes Mother    Prostate cancer Father    Hypertension Sister    Ovarian cancer Neg Hx    Colon cancer Neg Hx    Endometrial cancer Neg Hx    Pancreatic cancer Neg Hx     Vitals:  BP 110/65 (BP Location: Left Arm, Patient Position: Sitting)   Pulse 66   Temp 98.1 F (36.7 C) (Oral)   Resp 16   Ht _0  (1.575 m)   Wt 154  lb 4.8 oz (70 kg)   SpO2 100%   BMI 28.22 kg/m   Physical Exam:  Physical Exam Genitourinary:    General: Normal vulva.     Vagina: Normal. No vaginal discharge.     Cervix: Lesions: stenotic external os.     Comments: Tethering of right fornix     Assessment/Plan:   VAIN (vaginal intraepithelial neoplasia) Unremarkable exam  > Clinical examination annually. > Annual co-testing (cytology and HPV)  > Repeat colposcopy  for HPV-positive test or any cytologic abnormality.   ASCUS with positive high risk HPV cervical CIN 1 Negative follow-up Pap w/HR-HPV co-testing  > Resume Pap testing at routine screening  intervals      I personally spent 25 minutes face-to-face and non-face-to-face in the care of this patient, which includes all pre, intra, and post visit time on the date of service.   Lahoma Crocker, MD 12/15/2021, 9:10 AM

## 2021-12-15 NOTE — Patient Instructions (Addendum)
Return in 1 year or as needed 

## 2021-12-15 NOTE — Assessment & Plan Note (Addendum)
CIN 1 Negative follow-up Pap w/HR-HPV co-testing  > Resume Pap testing at routine screening intervals

## 2021-12-16 LAB — CYTOLOGY - PAP
Adequacy: ABSENT
Comment: NEGATIVE
Diagnosis: NEGATIVE
High risk HPV: NEGATIVE

## 2023-02-09 ENCOUNTER — Encounter: Payer: Self-pay | Admitting: Obstetrics & Gynecology

## 2023-02-15 ENCOUNTER — Inpatient Hospital Stay: Payer: Commercial Managed Care - PPO | Attending: Obstetrics & Gynecology | Admitting: Obstetrics & Gynecology

## 2023-02-15 ENCOUNTER — Other Ambulatory Visit (HOSPITAL_COMMUNITY)
Admission: RE | Admit: 2023-02-15 | Discharge: 2023-02-15 | Disposition: A | Discharge status: Home / Self Care | Payer: Commercial Managed Care - PPO | Source: Ambulatory Visit | Attending: Obstetrics & Gynecology | Admitting: Obstetrics & Gynecology

## 2023-02-15 ENCOUNTER — Other Ambulatory Visit: Payer: Self-pay

## 2023-02-15 ENCOUNTER — Encounter: Payer: Self-pay | Admitting: Obstetrics & Gynecology

## 2023-02-15 VITALS — BP 130/80 | HR 104 | Temp 98.6°F | Resp 18 | Ht 62.0 in | Wt 165.4 lb

## 2023-02-15 DIAGNOSIS — Z87410 Personal history of cervical dysplasia: Secondary | ICD-10-CM

## 2023-02-15 DIAGNOSIS — Z87411 Personal history of vaginal dysplasia: Secondary | ICD-10-CM | POA: Diagnosis present

## 2023-02-15 DIAGNOSIS — N893 Dysplasia of vagina, unspecified: Secondary | ICD-10-CM

## 2023-02-15 NOTE — Assessment & Plan Note (Addendum)
Unremarkable exam   >Review the Pap test result from the cervix and upper vagina > Continue clinical examination annually. > Annual co-testing (cytology and HPV)             > Repeat colposcopy  for HPV-positive test or any cytologic abnormality.   >Follow-up w/a generalist is appropriate

## 2023-02-15 NOTE — Patient Instructions (Signed)
Return as needed

## 2023-02-15 NOTE — Progress Notes (Signed)
Follow Up Note: Gyn-Onc  Sandra Hale 62 y.o. female  CC: She presents for a follow-up visit  HPI: She carries a diagnosis of multi-focal VAIN 2 in 10/19.  She has been treated w/Efudex.  She also has a h/o biopsy-proven CIN 1.  Follow-up colposcopic-directed biopsies and Pap testing were benign/neg.     Interval History:  A Pap in 5/23 showed NILM-HR-HPV neg.  No complaints at today's visit.     Review of Systems  Review of Systems  Constitutional:  Negative for malaise/fatigue and weight loss.  Genitourinary:  Negative for dysuria, frequency, hematuria and urgency.    Current Meds:  Outpatient Encounter Medications as of 02/15/2023  Medication Sig   Blood Glucose Monitoring Suppl (ACCU-CHEK GUIDE) w/Device KIT See admin instructions.   Cholecalciferol (VITAMIN D) 50 MCG (2000 UT) tablet 1 tablet   glucose blood (ACCU-CHEK GUIDE) test strip See admin instructions.   hydrocortisone 2.5 % cream Apply topically daily as needed.   rosuvastatin (CRESTOR) 20 MG tablet Take 1 tablet by mouth once a week.   No facility-administered encounter medications on file as of 02/15/2023.    Allergy: No Known Allergies  Social Hx:   Social History   Socioeconomic History   Marital status: Single    Spouse name: Not on file   Number of children: Not on file   Years of education: Not on file   Highest education level: Not on file  Occupational History   Not on file  Tobacco Use   Smoking status: Never   Smokeless tobacco: Never  Vaping Use   Vaping status: Never Used  Substance and Sexual Activity   Alcohol use: Never   Drug use: Never   Sexual activity: Not Currently  Other Topics Concern   Not on file  Social History Narrative   Not on file   Social Determinants of Health   Financial Resource Strain: Not on file  Food Insecurity: Not on file  Transportation Needs: Not on file  Physical  Activity: Not on file  Stress: Not on file  Social Connections: Not on file  Intimate Partner Violence: Not on file    Past Surgical Hx: History reviewed. No pertinent surgical history.  Past Medical Hx: History reviewed. No pertinent past medical history.  Family Hx:  Family History  Problem Relation Age of Onset   Breast cancer Mother    Diabetes Mother    Prostate cancer Father    Hypertension Sister    Ovarian cancer Neg Hx    Colon cancer Neg Hx    Endometrial cancer Neg Hx    Pancreatic cancer Neg Hx     Vitals:  BP 130/80 (BP Location: Left Arm, Patient Position: Sitting)   Pulse (!) 104   Temp 98.6 F (37 C) (Oral)   Resp 18   Ht 5\' 2"  (1.575 m)   Wt 165 lb 6.4 oz (75 kg)   SpO2 98%   BMI 30.25 kg/m   Physical Exam:  Physical Exam Genitourinary:    General: Normal vulva.     Vagina: Normal. No vaginal discharge.     Cervix: Lesions: stenotic external os.     Adnexa: Right adnexa normal and left adnexa normal.     Rectum: External hemorrhoid (at 12 o'clock) present.     Comments: Tethering of right fornix Cx flush  Uterus mid position, irregular contour      Assessment/Plan:   VAIN (vaginal intraepithelial neoplasia) Unremarkable exam   >Review the Pap  test result from the cervix and upper vagina > Continue clinical examination annually. > Annual co-testing (cytology and HPV)             > Repeat colposcopy  for HPV-positive test or any cytologic abnormality.   >Follow-up w/a generalist is appropriate       I personally spent 25 minutes face-to-face and non-face-to-face in the care of this patient, which includes all pre, intra, and post visit time on the date of service.   Antionette Char, MD 02/15/2023, 3:26 PM

## 2023-03-14 ENCOUNTER — Telehealth: Payer: Self-pay | Admitting: *Deleted

## 2023-03-14 NOTE — Telephone Encounter (Signed)
-----   Message from Doylene Bode sent at 03/14/2023 11:01 AM EDT ----- Please let the patient know her pap smear did not show precancer or cancer but HPV high risk was detected (positive) so we will need to take a closer look with colposcopy (exam with magnification of the tissues).  Please set her up for a colpo appt with Dr. Tina Griffiths when she is here next.   Thanks

## 2023-03-14 NOTE — Telephone Encounter (Signed)
Spoke with Ms. Sandra Sandra and relayed message from Warner Mccreedy, NP that pt's pap smear did not show precancer or cancer but HPV high risk was detected (positive) so we will need to take a closer look with colposcopy (exam with magnification of the tissues).  Pt verbalized understanding and was given an appointment on Wednesday, September 25 th. At 3:15 pm with arrival time of 3:00 pm, pt agreed to date and time and thanked the office for calling.

## 2023-04-14 ENCOUNTER — Encounter: Payer: Self-pay | Admitting: Obstetrics & Gynecology

## 2023-04-19 ENCOUNTER — Inpatient Hospital Stay: Payer: Commercial Managed Care - PPO | Attending: Obstetrics & Gynecology | Admitting: Obstetrics & Gynecology

## 2023-04-19 VITALS — BP 138/63 | HR 87 | Temp 98.6°F | Resp 20 | Wt 167.0 lb

## 2023-04-19 DIAGNOSIS — N893 Dysplasia of vagina, unspecified: Secondary | ICD-10-CM | POA: Insufficient documentation

## 2023-04-19 NOTE — Patient Instructions (Signed)
Return in 1 year ?

## 2023-04-19 NOTE — Progress Notes (Unsigned)
Colpo benign

## 2023-04-20 ENCOUNTER — Encounter: Payer: Self-pay | Admitting: Obstetrics & Gynecology
